# Patient Record
Sex: Male | Born: 1950 | Race: White | Hispanic: No | Marital: Married | Smoking: Former smoker
Health system: Southern US, Community
[De-identification: ages and names within clinical notes are randomized; demographics above are authoritative.]

## PROBLEM LIST (undated history)

## (undated) DIAGNOSIS — E119 Type 2 diabetes mellitus without complications: Secondary | ICD-10-CM

## (undated) DIAGNOSIS — L570 Actinic keratosis: Secondary | ICD-10-CM

## (undated) DIAGNOSIS — K08109 Complete loss of teeth, unspecified cause, unspecified class: Secondary | ICD-10-CM

## (undated) DIAGNOSIS — E039 Hypothyroidism, unspecified: Secondary | ICD-10-CM

## (undated) HISTORY — DX: Actinic keratosis: L57.0

---

## 1998-09-12 ENCOUNTER — Emergency Department (HOSPITAL_COMMUNITY): Admission: EM | Admit: 1998-09-12 | Discharge: 1998-09-12 | Payer: Self-pay | Admitting: Emergency Medicine

## 2008-08-25 ENCOUNTER — Ambulatory Visit: Payer: Self-pay | Admitting: Internal Medicine

## 2008-09-22 ENCOUNTER — Ambulatory Visit: Payer: Self-pay | Admitting: Internal Medicine

## 2008-11-21 ENCOUNTER — Ambulatory Visit: Payer: Self-pay | Admitting: General Surgery

## 2010-03-21 ENCOUNTER — Emergency Department: Payer: Self-pay | Admitting: Unknown Physician Specialty

## 2016-12-05 ENCOUNTER — Ambulatory Visit: Payer: Self-pay | Admitting: General Surgery

## 2016-12-27 ENCOUNTER — Telehealth: Payer: Self-pay | Admitting: *Deleted

## 2016-12-27 NOTE — Telephone Encounter (Signed)
Patient called and was scheduled to have a colonoscopy in 11/2016 and doesn't want to have that procedure done right now

## 2017-05-25 ENCOUNTER — Ambulatory Visit: Payer: Self-pay | Admitting: General Surgery

## 2017-09-21 ENCOUNTER — Ambulatory Visit: Payer: Self-pay | Admitting: General Surgery

## 2017-09-28 ENCOUNTER — Other Ambulatory Visit: Payer: Self-pay

## 2017-09-28 ENCOUNTER — Encounter: Payer: Self-pay | Admitting: Emergency Medicine

## 2017-09-28 ENCOUNTER — Emergency Department: Payer: Medicare Other

## 2017-09-28 ENCOUNTER — Emergency Department
Admission: EM | Admit: 2017-09-28 | Discharge: 2017-09-28 | Disposition: A | Discharge status: Home / Self Care | Payer: Medicare Other | Attending: Student in an Organized Health Care Education/Training Program | Admitting: Student in an Organized Health Care Education/Training Program

## 2017-09-28 DIAGNOSIS — Z87891 Personal history of nicotine dependence: Secondary | ICD-10-CM | POA: Diagnosis not present

## 2017-09-28 DIAGNOSIS — N201 Calculus of ureter: Secondary | ICD-10-CM | POA: Insufficient documentation

## 2017-09-28 DIAGNOSIS — R1032 Left lower quadrant pain: Secondary | ICD-10-CM | POA: Diagnosis present

## 2017-09-28 DIAGNOSIS — Z79899 Other long term (current) drug therapy: Secondary | ICD-10-CM | POA: Diagnosis not present

## 2017-09-28 DIAGNOSIS — E039 Hypothyroidism, unspecified: Secondary | ICD-10-CM | POA: Insufficient documentation

## 2017-09-28 HISTORY — DX: Hypothyroidism, unspecified: E03.9

## 2017-09-28 LAB — COMPREHENSIVE METABOLIC PANEL
ALBUMIN: 4.6 g/dL (ref 3.5–5.0)
ALK PHOS: 39 U/L (ref 38–126)
ALT: 29 U/L (ref 17–63)
ANION GAP: 11 (ref 5–15)
AST: 33 U/L (ref 15–41)
BILIRUBIN TOTAL: 0.6 mg/dL (ref 0.3–1.2)
BUN: 25 mg/dL — AB (ref 6–20)
CALCIUM: 9.3 mg/dL (ref 8.9–10.3)
CO2: 20 mmol/L — ABNORMAL LOW (ref 22–32)
Chloride: 110 mmol/L (ref 101–111)
Creatinine, Ser: 1.17 mg/dL (ref 0.61–1.24)
GFR calc Af Amer: >60 mL/min (ref >60)
GFR calc non Af Amer: >60 mL/min (ref >60)
GLUCOSE: 169 mg/dL — AB (ref 65–99)
Potassium: 3.6 mmol/L (ref 3.5–5.1)
Sodium: 141 mmol/L (ref 135–145)
TOTAL PROTEIN: 7.5 g/dL (ref 6.5–8.1)

## 2017-09-28 LAB — CBC
HCT: 45.1 % (ref 40.0–52.0)
Hemoglobin: 15.3 g/dL (ref 13.0–18.0)
MCH: 30.8 pg (ref 26.0–34.0)
MCHC: 34 g/dL (ref 32.0–36.0)
MCV: 90.6 fL (ref 80.0–100.0)
PLATELETS: 232 10*3/uL (ref 150–440)
RBC: 4.98 MIL/uL (ref 4.40–5.90)
RDW: 13.1 % (ref 11.5–14.5)
WBC: 9.4 10*3/uL (ref 3.8–10.6)

## 2017-09-28 LAB — URINALYSIS, COMPLETE (UACMP) WITH MICROSCOPIC
Bilirubin Urine: NEGATIVE
GLUCOSE, UA: NEGATIVE mg/dL
Ketones, ur: 5 mg/dL — AB
Leukocytes, UA: NEGATIVE
NITRITE: NEGATIVE
PH: 5 (ref 5.0–8.0)
Protein, ur: NEGATIVE mg/dL
Specific Gravity, Urine: 1.023 (ref 1.005–1.030)

## 2017-09-28 LAB — LIPASE, BLOOD: Lipase: 33 U/L (ref 11–51)

## 2017-09-28 MED ORDER — IOPAMIDOL (ISOVUE-300) INJECTION 61%
100.0000 mL | Freq: Once | INTRAVENOUS | Status: AC | PRN
  Administered 2017-09-28: 100 mL via INTRAVENOUS

## 2017-09-28 MED ORDER — MORPHINE SULFATE (PF) 4 MG/ML IV SOLN
4.0000 mg | INTRAVENOUS | Status: DC | PRN
  Administered 2017-09-28: 4 mg via INTRAVENOUS
  Filled 2017-09-28: qty 1

## 2017-09-28 MED ORDER — KETOROLAC TROMETHAMINE 30 MG/ML IJ SOLN
15.0000 mg | Freq: Once | INTRAMUSCULAR | Status: AC
  Administered 2017-09-28: 15 mg via INTRAVENOUS
  Filled 2017-09-28: qty 1

## 2017-09-28 MED ORDER — HYDROCODONE-ACETAMINOPHEN 5-325 MG PO TABS: 1.0000 | ORAL_TABLET | ORAL | 0 refills | Status: DC | PRN

## 2017-09-28 MED ORDER — PROMETHAZINE HCL 25 MG/ML IJ SOLN
12.5000 mg | Freq: Four times a day (QID) | INTRAMUSCULAR | Status: DC | PRN
  Administered 2017-09-28: 12.5 mg via INTRAVENOUS
  Filled 2017-09-28: qty 1

## 2017-09-28 MED ORDER — POLYETHYLENE GLYCOL 3350 17 G PO PACK: 17.0000 g | PACK | Freq: Every day | ORAL | 0 refills | Status: DC

## 2017-09-28 MED ORDER — TAMSULOSIN HCL 0.4 MG PO CAPS: 0.4000 mg | ORAL_CAPSULE | Freq: Every day | ORAL | 0 refills | Status: DC

## 2017-09-28 MED ORDER — FENTANYL CITRATE (PF) 100 MCG/2ML IJ SOLN
50.0000 ug | INTRAMUSCULAR | Status: DC | PRN
  Administered 2017-09-28: 50 ug via INTRAVENOUS
  Filled 2017-09-28: qty 2

## 2017-09-28 MED ORDER — ONDANSETRON 4 MG PO TBDP
4.0000 mg | ORAL_TABLET | Freq: Once | ORAL | Status: AC | PRN
  Administered 2017-09-28: 4 mg via ORAL
  Filled 2017-09-28: qty 1

## 2017-09-28 MED ORDER — SODIUM CHLORIDE 0.9 % IV BOLUS (SEPSIS)
500.0000 mL | Freq: Once | INTRAVENOUS | Status: AC
Start: 2017-09-28 — End: 2017-09-28
  Administered 2017-09-28: 500 mL via INTRAVENOUS

## 2017-09-28 NOTE — ED Notes (Signed)
Informed RN that patient has been roomed and is ready for evaluation.  Patient in NAD at this time and call bell placed within reach.   

## 2017-09-28 NOTE — ED Provider Notes (Signed)
[facility] Emergency Department Provider Note    None    (approximate)  I have reviewed the triage vital signs and the nursing notes.   HISTORY  Chief Complaint Abdominal Pain    HPI Paul [name] is a 67 y.o. male with a history of hypothyroidism taking levothyroxine no recent admissions to hospital presents with chief complaint of sudden onset left lower quadrant abdominal pain that he rates as severe and nonradiating.  States he does have some pain that will radiate to the groin and left testicle but is primarily located in the left groin.  None radiating through to his back.  States he did have some episodes of loose bowels.  States that yesterday he was feeling nauseated has never had pain anything like this before.  Denies any blood in his stool.  No noted dysuria or blood in his urine.  No chest pain or shortness of breath.  Past Medical History:  Diagnosis Date  . Hypothyroidism    History reviewed. No pertinent family history. History reviewed. No pertinent surgical history. There are no active problems to display for this patient.     Prior to Admission medications   Medication Sig Start Date End Date Taking? Authorizing Provider  LEVOXYL 25 MCG tablet Take 1 tablet by mouth daily. 06/24/17  Yes [provider]  HYDROcodone-acetaminophen (NORCO) 5-325 MG tablet Take 1 tablet by mouth every 4 (four) hours as needed for moderate pain. 09/28/17   Merlyn Lot, MD  polyethylene glycol [facility] / Floria Raveling) packet Take 17 g by mouth daily. Mix one tablespoon with 8oz of your favorite juice or water every day until you are having soft formed stools. Then start taking once daily if you didn't have a stool the day before. 09/28/17   Merlyn Lot, MD  tamsulosin (FLOMAX) 0.4 MG CAPS capsule Take 1 capsule (0.4 mg total) by mouth daily after supper. 09/28/17   Merlyn Lot, MD    Allergies Patient has no known  allergies.    Social History Social History   Tobacco Use  . Smoking status: Former Smoker    Last attempt to quit: 1999    Years since quitting: 20.2  . Smokeless tobacco: Never Used  Substance Use Topics  . Alcohol use: Yes    Comment: rarely  . Drug use: No    Review of Systems Patient denies headaches, rhinorrhea, blurry vision, numbness, shortness of breath, chest pain, edema, cough, abdominal pain, nausea, vomiting, diarrhea, dysuria, fevers, rashes or hallucinations unless otherwise stated above in HPI. ____________________________________________   PHYSICAL EXAM:  VITAL SIGNS: Vitals:   09/28/17 1900 09/28/17 1930  BP: 120/61 134/80  Pulse: 75 74  Resp:    Temp:    SpO2: 97% 95%    Constitutional: Alert and oriented. in no acute distress. Eyes: Conjunctivae are normal.  Head: Atraumatic. Nose: No congestion/rhinnorhea. Mouth/Throat: Mucous membranes are moist.   Neck: No stridor. Painless ROM.  Cardiovascular: Normal rate, regular rhythm. Grossly normal heart sounds.  Good peripheral circulation. Respiratory: Normal respiratory effort.  No retractions. Lungs CTAB. Gastrointestinal: Soft and nontender. No distention. No abdominal bruits. No CVA tenderness. Genitourinary:  Musculoskeletal: No lower extremity tenderness nor edema.  No joint effusions. Neurologic:  Normal speech and language. No gross focal neurologic deficits are appreciated. No facial droop Skin:  Skin is warm, dry and intact. No rash noted. Psychiatric: Mood and affect are normal. Speech and behavior are normal.  ____________________________________________   LABS (all labs ordered  are listed, but only abnormal results are displayed)  Results for orders placed or performed during the hospital encounter of 09/28/17 (from the past 24 hour(s))  Lipase, blood     Status: None   Collection Time: 09/28/17  3:21 PM  Result Value Ref Range   Lipase 33 11 - 51 U/L  Comprehensive metabolic  panel     Status: Abnormal   Collection Time: 09/28/17  3:21 PM  Result Value Ref Range   Sodium 141 135 - 145 mmol/L   Potassium 3.6 3.5 - 5.1 mmol/L   Chloride 110 101 - 111 mmol/L   CO2 20 (L) 22 - 32 mmol/L   Glucose, Bld 169 (H) 65 - 99 mg/dL   BUN 25 (H) 6 - 20 mg/dL   Creatinine, Ser 1.17 0.61 - 1.24 mg/dL   Calcium 9.3 8.9 - 10.3 mg/dL   Total Protein 7.5 6.5 - 8.1 g/dL   Albumin 4.6 3.5 - 5.0 g/dL   AST 33 15 - 41 U/L   ALT 29 17 - 63 U/L   Alkaline Phosphatase 39 38 - 126 U/L   Total Bilirubin 0.6 0.3 - 1.2 mg/dL   GFR calc non Af Amer >60 >60 mL/min   GFR calc Af Amer >60 >60 mL/min   Anion gap 11 5 - 15  CBC     Status: None   Collection Time: 09/28/17  3:21 PM  Result Value Ref Range   WBC 9.4 3.8 - 10.6 K/uL   RBC 4.98 4.40 - 5.90 MIL/uL   Hemoglobin 15.3 13.0 - 18.0 g/dL   HCT 45.1 40.0 - 52.0 %   MCV 90.6 80.0 - 100.0 fL   MCH 30.8 26.0 - 34.0 pg   MCHC 34.0 32.0 - 36.0 g/dL   RDW 13.1 11.5 - 14.5 %   Platelets 232 150 - 440 K/uL  Urinalysis, Complete w Microscopic     Status: Abnormal   Collection Time: 09/28/17  3:21 PM  Result Value Ref Range   Color, Urine YELLOW (A) YELLOW   APPearance HAZY (A) CLEAR   Specific Gravity, Urine 1.023 1.005 - 1.030   pH 5.0 5.0 - 8.0   Glucose, UA NEGATIVE NEGATIVE mg/dL   Hgb urine dipstick LARGE (A) NEGATIVE   Bilirubin Urine NEGATIVE NEGATIVE   Ketones, ur 5 (A) NEGATIVE mg/dL   Protein, ur NEGATIVE NEGATIVE mg/dL   Nitrite NEGATIVE NEGATIVE   Leukocytes, UA NEGATIVE NEGATIVE   RBC / HPF TOO NUMEROUS TO COUNT 0 - 5 RBC/hpf   WBC, UA 0-5 0 - 5 WBC/hpf   Bacteria, UA RARE (A) NONE SEEN   Squamous Epithelial / LPF 0-5 (A) NONE SEEN   Mucus PRESENT    Budding Yeast PRESENT    ____________________________________________  ____________________________________________  RADIOLOGY  I personally reviewed all radiographic images ordered to evaluate for the above acute complaints and reviewed radiology reports and  findings.  These findings were personally discussed with the patient.  Please see medical record for radiology report.  ____________________________________________   PROCEDURES  Procedure(s) performed:  Procedures    Critical Care performed: no ____________________________________________   INITIAL IMPRESSION / ASSESSMENT AND PLAN / ED COURSE  Pertinent labs & imaging results that were available during my care of the patient were reviewed by me and considered in my medical decision making (see chart for details).  DDX: Stone, Pilo, diverticulitis, perforation, colitis, UTI  TAHA DIMOND is a 67 y.o. who presents to the ED with symptoms as  described above.  Sudden onset left lower quadrant pain concerning for the above differential.  Blood work sent for the above differential shows no leukocytosis-or significant acidosis.  Urinalysis does show hematuria without pyuria or bacteria.  CT imaging ordered to further evaluate for the patient's pain shows evidence of distal left UPJ stone with associated hydronephrosis and perinephric edema.  No evidence of sepsis or infectious process.  Patient given IV fluids and IV pain medication.  Will reassess for pain control.  Clinical Course as of Sep 28 1952  Thu Sep 28, 2017  1949 Patient reassessed and feels pain is resolved.  He is tolerating oral hydration.  Able to ambulate with steady gait.  This point do believe patient stable and appropriate for outpatient referral to urology.  [PR]    Clinical Course User Index [PR] Merlyn Lot, MD     ____________________________________________   FINAL CLINICAL IMPRESSION(S) / ED DIAGNOSES  Final diagnoses:  Left lower quadrant pain  Ureterolithiasis      NEW MEDICATIONS STARTED DURING THIS VISIT:  New Prescriptions   HYDROCODONE-ACETAMINOPHEN (NORCO) 5-325 MG TABLET    Take 1 tablet by mouth every 4 (four) hours as needed for moderate pain.   POLYETHYLENE GLYCOL (MIRALAX /  GLYCOLAX) PACKET    Take 17 g by mouth daily. Mix one tablespoon with 8oz of your favorite juice or water every day until you are having soft formed stools. Then start taking once daily if you didn't have a stool the day before.   TAMSULOSIN (FLOMAX) 0.4 MG CAPS CAPSULE    Take 1 capsule (0.4 mg total) by mouth daily after supper.     Note:  This document was prepared using Dragon voice recognition software and may include unintentional dictation errors.    Merlyn Lot, MD 09/28/17 [phone]

## 2017-09-28 NOTE — ED Triage Notes (Addendum)
Pt presents to ED c/o nausea x1 week, diarrhea today, and LLQ pain. Family report pt has a sister seen at [facility] who was recently dx with C-Diff and that pt took 1 dose of bactrim because "we thought he might have some inflammation."

## 2017-09-28 NOTE — ED Notes (Signed)
Patient informed this RN that he needed to have a bowel movement and that he wanted to provide a sample since he has been having diarrhea.  Patient given hat for sample.  Sample was a well formed stool at this time and not sent down to lab due to not meeting criteria for c-diff protocol.

## 2017-09-28 NOTE — ED Notes (Signed)
Patient transported to CT 

## 2017-10-09 DIAGNOSIS — C4491 Basal cell carcinoma of skin, unspecified: Secondary | ICD-10-CM

## 2017-10-09 HISTORY — DX: Basal cell carcinoma of skin, unspecified: C44.91

## 2017-10-12 ENCOUNTER — Encounter: Payer: Self-pay | Admitting: Urology

## 2017-10-12 ENCOUNTER — Ambulatory Visit (INDEPENDENT_AMBULATORY_CARE_PROVIDER_SITE_OTHER): Payer: Medicare Other | Admitting: Urology

## 2017-10-12 VITALS — BP 125/81 | HR 87 | Ht 71.0 in | Wt 220.0 lb

## 2017-10-12 DIAGNOSIS — N200 Calculus of kidney: Secondary | ICD-10-CM | POA: Diagnosis not present

## 2017-10-12 LAB — URINALYSIS, COMPLETE
Bilirubin, UA: NEGATIVE
Glucose, UA: NEGATIVE
KETONES UA: NEGATIVE
NITRITE UA: NEGATIVE
Protein, UA: NEGATIVE
RBC, UA: NEGATIVE
Specific Gravity, UA: 1.02 (ref 1.005–1.030)
UUROB: 0.2 mg/dL (ref 0.2–1.0)
pH, UA: 5.5 (ref 5.0–7.5)

## 2017-10-12 LAB — MICROSCOPIC EXAMINATION

## 2017-10-12 MED ORDER — TAMSULOSIN HCL 0.4 MG PO CAPS: 0.4000 mg | ORAL_CAPSULE | Freq: Every day | ORAL | 0 refills | Status: DC

## 2017-10-12 NOTE — Progress Notes (Signed)
10/12/2017 3:24 PM   Paul [name] Dec 26, 1950 [id number]  Referring provider: Adin Hector, MD [facility] [place], [place] [address]  Chief Complaint  Patient presents with  . Nephrolithiasis    HPI: The patient is a 67 year old gentleman who presents for ER follow-up of a 4 x 3 mm left UPJ calculus diagnosed 2 weeks ago.  The patient originally presented with nausea and left-sided flank pain.  This was the only stone seen in the CT scan.  Since his ER visit, his pain is been much more controlled.  However it still persistent and now is lower in his flank and left lower quadrant.  He does not think he has passed the stone.  He is strained with a homemade strainer.  He is interested in continue medical expulsive therapy.  This is his first stone episode.   PMH: Past Medical History:  Diagnosis Date  . Hypothyroidism     Surgical History: History reviewed. No pertinent surgical history.  Home Medications:  Allergies as of 10/12/2017   No Known Allergies     Medication List        Accurate as of 10/12/17  3:24 PM. Always use your most recent med list.          acyclovir 400 MG tablet Commonly known as:  ZOVIRAX Take by mouth.   HYDROcodone-acetaminophen 5-325 MG tablet Commonly known as:  NORCO Take 1 tablet by mouth every 4 (four) hours as needed for moderate pain.   LEVOXYL 25 MCG tablet Generic drug:  levothyroxine Take 1 tablet by mouth daily.   polyethylene glycol packet Commonly known as:  MIRALAX / GLYCOLAX Take 17 g by mouth daily. Mix one tablespoon with 8oz of your favorite juice or water every day until you are having soft formed stools. Then start taking once daily if you didn't have a stool the day before.   tamsulosin 0.4 MG Caps capsule Commonly known as:  FLOMAX Take 1 capsule (0.4 mg total) by mouth daily after supper.       Allergies: No Known Allergies  Family History: Family History  Problem  Relation Age of Onset  . Prostate cancer Neg Hx   . Bladder Cancer Neg Hx   . Kidney cancer Neg Hx     Social History:  reports that he quit smoking about 20 years ago. He has never used smokeless tobacco. He reports that he drinks alcohol. He reports that he does not use drugs.  ROS: UROLOGY Frequent Urination?: No Hard to postpone urination?: No Burning/pain with urination?: No Get up at night to urinate?: Yes Leakage of urine?: No Urine stream starts and stops?: No Trouble starting stream?: No Do you have to strain to urinate?: No Blood in urine?: Yes Urinary tract infection?: No Sexually transmitted disease?: No Injury to kidneys or bladder?: No Painful intercourse?: No Weak stream?: No Erection problems?: No Penile pain?: No  Gastrointestinal Nausea?: Yes Vomiting?: Yes Indigestion/heartburn?: No Diarrhea?: Yes Constipation?: No  Constitutional Fever: No Night sweats?: No Weight loss?: Yes Fatigue?: Yes  Skin Skin rash/lesions?: No Itching?: No  Eyes Blurred vision?: No Double vision?: No  Ears/Nose/Throat Sore throat?: No Sinus problems?: No  Hematologic/Lymphatic Swollen glands?: No Easy bruising?: No  Cardiovascular Leg swelling?: No Chest pain?: No  Respiratory Cough?: Yes Shortness of breath?: No  Endocrine Excessive thirst?: No  Musculoskeletal Back pain?: Yes Joint pain?: Yes  Neurological Headaches?: Yes Dizziness?: No  Psychologic Depression?: No Anxiety?: No  Physical Exam: BP 125/81   Pulse 87   Ht 5\' 11"  (1.803 m)   Wt 220 lb (99.8 kg)   BMI 30.68 kg/m   Constitutional:  Alert and oriented, No acute distress. HEENT: [place] AT, moist mucus membranes.  Trachea midline, no masses. Cardiovascular: No clubbing, cyanosis, or edema. Respiratory: Normal respiratory effort, no increased work of breathing. GI: Abdomen is soft, nontender, nondistended, no abdominal masses GU: No CVA tenderness.  Skin: No rashes, bruises or  suspicious lesions. Lymph: No cervical or inguinal adenopathy. Neurologic: Grossly intact, no focal deficits, moving all 4 extremities. Psychiatric: Normal mood and affect.  Laboratory Data: Lab Results  Component Value Date   WBC 9.4 09/28/2017   HGB 15.3 09/28/2017   HCT 45.1 09/28/2017   MCV 90.6 09/28/2017   PLT 232 09/28/2017    Lab Results  Component Value Date   CREATININE 1.17 09/28/2017    No results found for: PSA  No results found for: TESTOSTERONE  No results found for: HGBA1C  Urinalysis    Component Value Date/Time   COLORURINE YELLOW (A) 09/28/2017 1521   APPEARANCEUR HAZY (A) 09/28/2017 1521   LABSPEC 1.023 09/28/2017 1521   PHURINE 5.0 09/28/2017 1521   GLUCOSEU NEGATIVE 09/28/2017 1521   HGBUR LARGE (A) 09/28/2017 1521   BILIRUBINUR NEGATIVE 09/28/2017 1521   KETONESUR 5 (A) 09/28/2017 1521   PROTEINUR NEGATIVE 09/28/2017 1521   NITRITE NEGATIVE 09/28/2017 1521   LEUKOCYTESUR NEGATIVE 09/28/2017 1521    Pertinent Imaging: CT reviewed as above  Assessment & Plan:    1.  Left ureteral stone The patient still has symptoms of the presence of a stone.  He would like to continue medical expulsive therapy at this time.  We will have him follow-up in 2 weeks with a KUB prior.  In the interim, he will strain his urine.  He will also continue Flomax.   Return in about 2 weeks (around 10/26/2017) for KUB prior.  Nickie Retort, MD  [facility] [address], [place], [place] [address] [phone]

## 2017-10-20 NOTE — Progress Notes (Addendum)
10/23/2017 9:17 AM   Paul [name] 07-29-1950 [id number]  Referring provider: Adin Hector, MD [facility] [place] [address], [place] [address]  No chief complaint on file.   HPI: Patient is a 67 year old Caucasian male on MET for a 4 x 3 mm left UPJ stone with his wife, Paul [name].    10/12/2017 visit The patient is a 67 year old gentleman who presents for ER follow-up of a 4 x 3 mm left UPJ calculus diagnosed 2 weeks ago.  The patient originally presented with nausea and left-sided flank pain.  This was the only stone seen in the CT scan.  Since his ER visit, his pain is been much more controlled.  However it still persistent and now is lower in his flank and left lower quadrant.  He does not think he has passed the stone.  He is strained with a homemade strainer.  He is interested in continue medical expulsive therapy.  This is his first stone episode.  Today, he brings in a stone for analysis.  He passed it on 10/14/2017.  Patient denies any gross hematuria, dysuria or suprapubic/flank pain.  Patient denies any fevers, chills, nausea or vomiting.   His KUB on 10/23/2017 was negative for stones.    PMH: Past Medical History:  Diagnosis Date  . Hypothyroidism     Surgical History: History reviewed. No pertinent surgical history.  Home Medications:  Allergies as of 10/23/2017   No Known Allergies     Medication List        Accurate as of 10/23/17  9:17 AM. Always use your most recent med list.          acyclovir 400 MG tablet Commonly known as:  ZOVIRAX Take by mouth.   HYDROcodone-acetaminophen 5-325 MG tablet Commonly known as:  NORCO Take 1 tablet by mouth every 4 (four) hours as needed for moderate pain.   LEVOXYL 25 MCG tablet Generic drug:  levothyroxine Take 1 tablet by mouth daily.   polyethylene glycol packet Commonly known as:  MIRALAX / GLYCOLAX Take 17 g by mouth daily. Mix one tablespoon with 8oz of your favorite juice or water  every day until you are having soft formed stools. Then start taking once daily if you didn't have a stool the day before.   tamsulosin 0.4 MG Caps capsule Commonly known as:  FLOMAX Take 1 capsule (0.4 mg total) by mouth daily after supper.   tamsulosin 0.4 MG Caps capsule Commonly known as:  FLOMAX Take 1 capsule (0.4 mg total) by mouth daily.       Allergies: No Known Allergies  Family History: Family History  Problem Relation Age of Onset  . Prostate cancer Neg Hx   . Bladder Cancer Neg Hx   . Kidney cancer Neg Hx     Social History:  reports that he quit smoking about 20 years ago. He has never used smokeless tobacco. He reports that he drinks alcohol. He reports that he does not use drugs.  ROS: UROLOGY Frequent Urination?: No Hard to postpone urination?: No Burning/pain with urination?: No Get up at night to urinate?: No Leakage of urine?: No Urine stream starts and stops?: No Trouble starting stream?: No Do you have to strain to urinate?: No Blood in urine?: No Urinary tract infection?: No Sexually transmitted disease?: No Injury to kidneys or bladder?: No Painful intercourse?: No Weak stream?: No Erection problems?: No Penile pain?: No  Gastrointestinal Nausea?: No Vomiting?: No Indigestion/heartburn?:  Yes Diarrhea?: No Constipation?: No  Constitutional Fever: No Night sweats?: No Weight loss?: No Fatigue?: No  Skin Skin rash/lesions?: No Itching?: No  Eyes Blurred vision?: No Double vision?: No  Ears/Nose/Throat Sore throat?: No Sinus problems?: No  Hematologic/Lymphatic Swollen glands?: No Easy bruising?: No  Cardiovascular Leg swelling?: No Chest pain?: No  Respiratory Cough?: No Shortness of breath?: No  Endocrine Excessive thirst?: No  Musculoskeletal Back pain?: No Joint pain?: No  Neurological Headaches?: No Dizziness?: No  Psychologic Depression?: No Anxiety?: No  Physical Exam: BP 118/79   Pulse (!) 59    Resp 16   Ht '5\' 11"'$  (1.803 m)   Wt 225 lb 9.6 oz (102.3 kg)   SpO2 98%   BMI 31.46 kg/m   .Constitutional: Well nourished. Alert and oriented, No acute distress. HEENT: [place] AT, moist mucus membranes. Trachea midline, no masses. Cardiovascular: No clubbing, cyanosis, or edema. Respiratory: Normal respiratory effort, no increased work of breathing. GI: Abdomen is soft, non tender, non distended, no abdominal masses. Liver and spleen not palpable.  No hernias appreciated.  Stool sample for occult testing is not indicated.   GU: No CVA tenderness.  No bladder fullness or masses.   Skin: No rashes, bruises or suspicious lesions. Lymph: No cervical or inguinal adenopathy. Neurologic: Grossly intact, no focal deficits, moving all 4 extremities. Psychiatric: Normal mood and affect.  Laboratory Data: Lab Results  Component Value Date   WBC 9.4 09/28/2017   HGB 15.3 09/28/2017   HCT 45.1 09/28/2017   MCV 90.6 09/28/2017   PLT 232 09/28/2017    Lab Results  Component Value Date   CREATININE 1.17 09/28/2017    No results found for: PSA  No results found for: TESTOSTERONE  No results found for: HGBA1C  Urinalysis    Component Value Date/Time   COLORURINE YELLOW (A) 09/28/2017 1521   APPEARANCEUR Clear 10/12/2017 1456   LABSPEC 1.023 09/28/2017 1521   PHURINE 5.0 09/28/2017 1521   GLUCOSEU Negative 10/12/2017 1456   HGBUR LARGE (A) 09/28/2017 1521   BILIRUBINUR Negative 10/12/2017 1456   KETONESUR 5 (A) 09/28/2017 1521   PROTEINUR Negative 10/12/2017 1456   PROTEINUR NEGATIVE 09/28/2017 1521   NITRITE Negative 10/12/2017 1456   NITRITE NEGATIVE 09/28/2017 1521   LEUKOCYTESUR 1+ (A) 10/12/2017 1456    Pertinent Imaging: CLINICAL DATA:  Left ureteral calculus  EXAM: ABDOMEN - 1 VIEW  COMPARISON:  CT abdomen and pelvis September 28, 2017  FINDINGS: Supine abdomen obtained. No abnormal calcifications are evident. There is moderate stool in the colon. There is no  bowel dilatation or air-fluid level to suggest bowel obstruction. No free air. There is lumbar dextroscoliosis.  IMPRESSION: No abnormal calcifications are evident by radiography. No bowel obstruction or free air.   Electronically Signed   By: Lowella Grip III M.D.   On: 10/23/2017 08:08  I have independently reviewed the films  Assessment & Plan:    1.  Left ureteral stone Joaquim Lai has been passed will send for analysis  2. Left Hydronephrosis Due to obstructing stone Will obtain a RUS once stone is passage to ensure hydronephrosis has resolved  Return for I will call patient with results.  Zara Council, [facility] [address] [place],  [address] [phone]

## 2017-10-23 ENCOUNTER — Encounter: Payer: Self-pay | Admitting: Urology

## 2017-10-23 ENCOUNTER — Ambulatory Visit (INDEPENDENT_AMBULATORY_CARE_PROVIDER_SITE_OTHER): Payer: Medicare Other | Admitting: Urology

## 2017-10-23 ENCOUNTER — Ambulatory Visit
Admission: RE | Admit: 2017-10-23 | Discharge: 2017-10-23 | Disposition: A | Discharge status: Home / Self Care | Payer: Medicare Other | Source: Ambulatory Visit | Attending: Urology | Admitting: Urology

## 2017-10-23 VITALS — BP 118/79 | HR 59 | Resp 16 | Ht 71.0 in | Wt 225.6 lb

## 2017-10-23 DIAGNOSIS — Z87442 Personal history of urinary calculi: Secondary | ICD-10-CM | POA: Insufficient documentation

## 2017-10-23 DIAGNOSIS — N132 Hydronephrosis with renal and ureteral calculous obstruction: Secondary | ICD-10-CM | POA: Diagnosis not present

## 2017-10-23 DIAGNOSIS — N200 Calculus of kidney: Secondary | ICD-10-CM

## 2017-10-23 DIAGNOSIS — N201 Calculus of ureter: Secondary | ICD-10-CM | POA: Diagnosis not present

## 2017-11-06 ENCOUNTER — Ambulatory Visit: Payer: Medicare Other

## 2017-11-08 ENCOUNTER — Other Ambulatory Visit: Payer: Self-pay | Admitting: Urology

## 2017-11-20 ENCOUNTER — Ambulatory Visit: Payer: Medicare Other

## 2017-12-13 ENCOUNTER — Ambulatory Visit
Admission: RE | Admit: 2017-12-13 | Discharge: 2017-12-13 | Disposition: A | Discharge status: Home / Self Care | Payer: Medicare Other | Source: Ambulatory Visit | Attending: Urology | Admitting: Urology

## 2017-12-13 DIAGNOSIS — N201 Calculus of ureter: Secondary | ICD-10-CM | POA: Diagnosis present

## 2017-12-13 DIAGNOSIS — K760 Fatty (change of) liver, not elsewhere classified: Secondary | ICD-10-CM | POA: Insufficient documentation

## 2017-12-13 DIAGNOSIS — N132 Hydronephrosis with renal and ureteral calculous obstruction: Secondary | ICD-10-CM

## 2017-12-14 ENCOUNTER — Telehealth: Payer: Self-pay

## 2017-12-14 NOTE — Telephone Encounter (Signed)
Called pt informed him of the information below. Pt gave verbal understanding.  

## 2017-12-14 NOTE — Telephone Encounter (Signed)
-----   Message from Nori Riis, PA-C sent at 12/13/2017  5:00 PM EDT ----- Please let Mr. Legrand know that his kidney is no longer swollen on the RUS.

## 2018-01-28 NOTE — Progress Notes (Signed)
I do not see where we have contacted Mr. Tupy regarding his stone analysis.  It returned at 87% calcium oxalate which is the most common type of stone people form.  He best chance of preventing more stones is to increase water intake, reduce animal protein in his diet and salt and stay away from sodas.

## 2018-01-30 NOTE — Progress Notes (Signed)
My chart message sent to patient with information.

## 2018-03-07 ENCOUNTER — Telehealth: Payer: Self-pay | Admitting: *Deleted

## 2018-03-07 NOTE — Telephone Encounter (Signed)
Talk to patient today and he states he did not want to follow back for his colonoscopy or do a color guard.

## 2018-12-23 IMAGING — CT CT ABD-PELV W/ CM
2 of 5 series · 15 of 46 positions shown, 17 images · IV contrast (APPLIED)
Comparison: None.

CLINICAL DATA: Nausea and left-sided abdominal pain

EXAM:
CT ABDOMEN AND PELVIS WITH CONTRAST
TECHNIQUE: Multidetector CT imaging of the abdomen and pelvis was performed
using the standard protocol following bolus administration of
intravenous contrast.
CONTRAST:  100mL 9NNGWA-HFF IOPAMIDOL (9NNGWA-HFF) INJECTION 61%

[Series 2: routine abd/pel with · axial · 0.83mm/px · z∈[-1014,-544]mm · 12 of 108 slices shown, 14 images]
[im 7/108  soft-tissue]
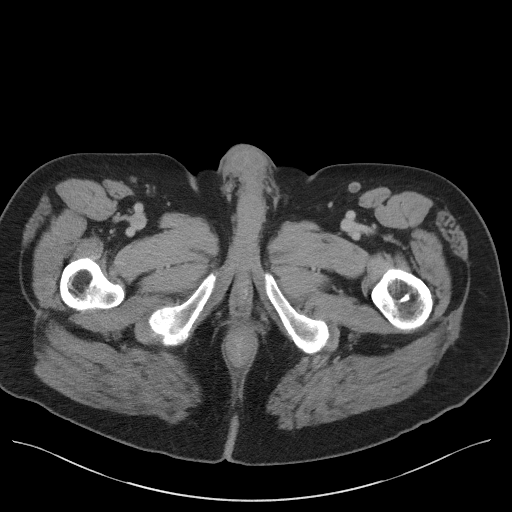
[im 7/108  bone]
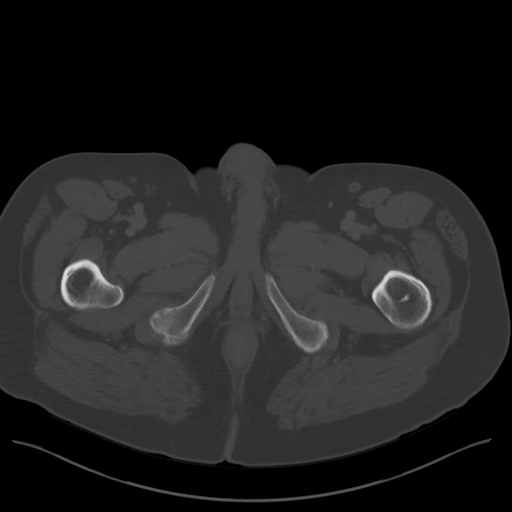
[im 19/108  soft-tissue]
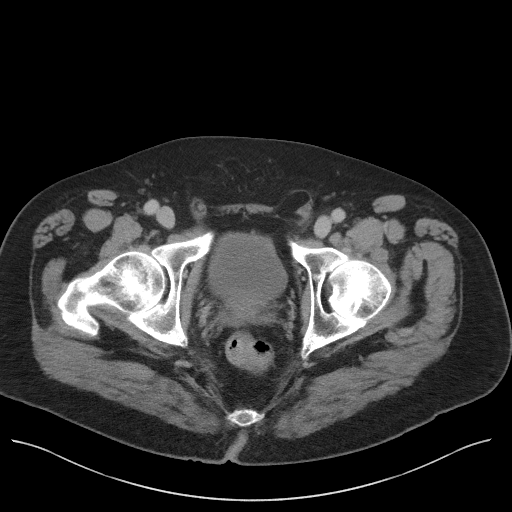
[im 26/108  soft-tissue]
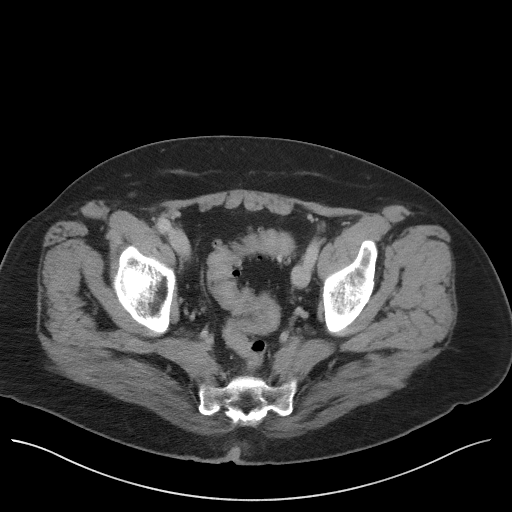
[im 32/108  soft-tissue]
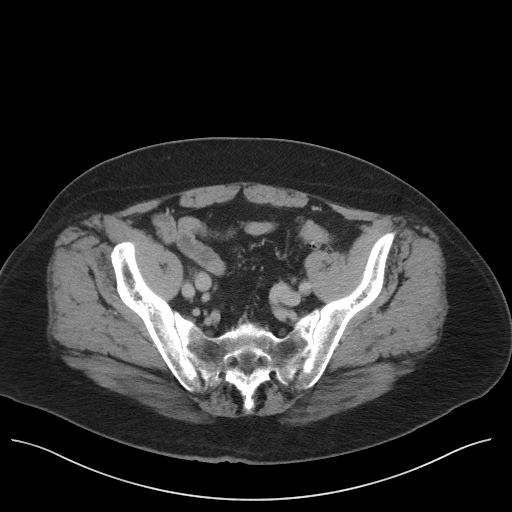
[im 45/108  soft-tissue]
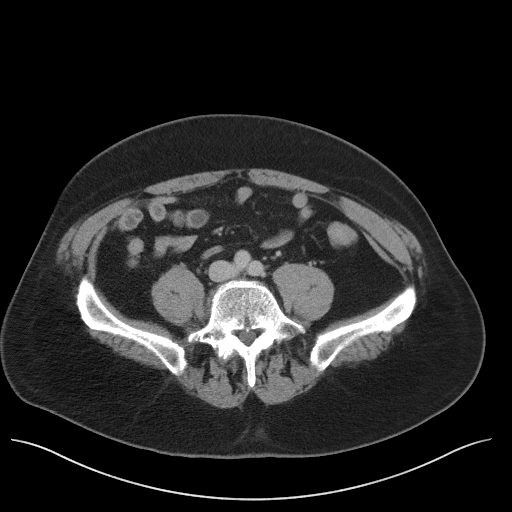
[im 51/108  soft-tissue]
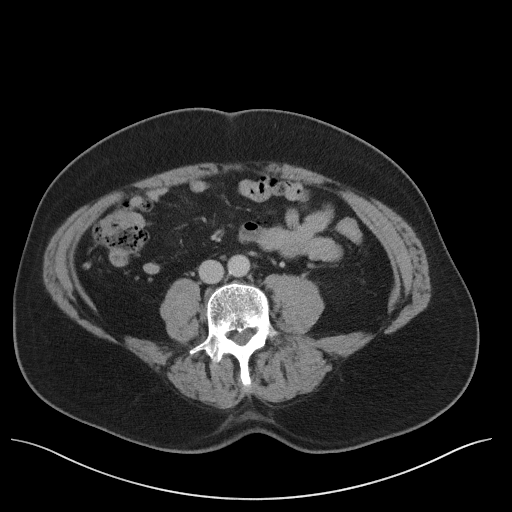
[im 57/108  soft-tissue]
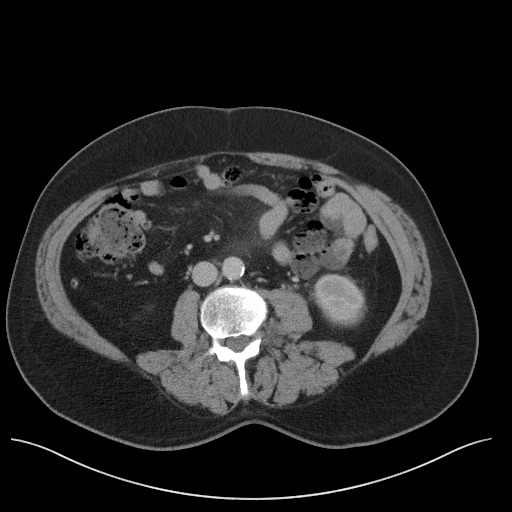
[im 70/108  soft-tissue]
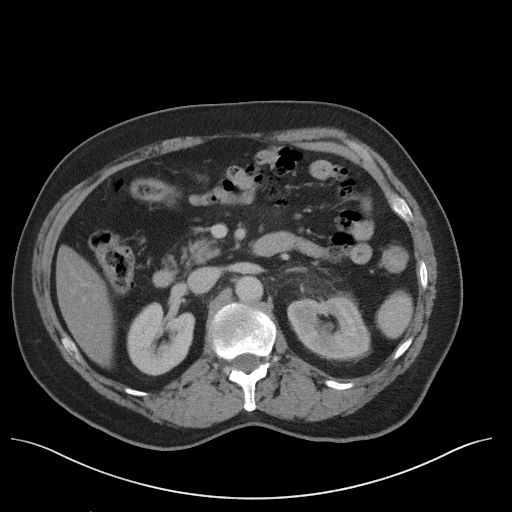
[im 76/108  soft-tissue]
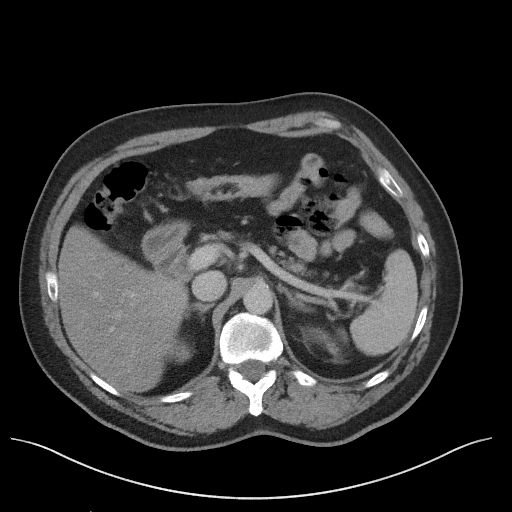
[im 76/108  bone]
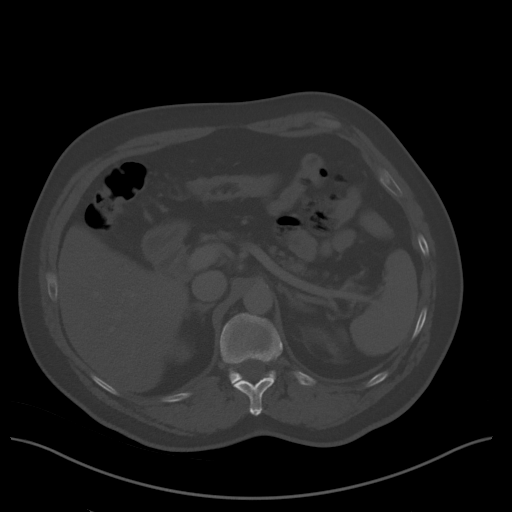
[im 82/108  soft-tissue]
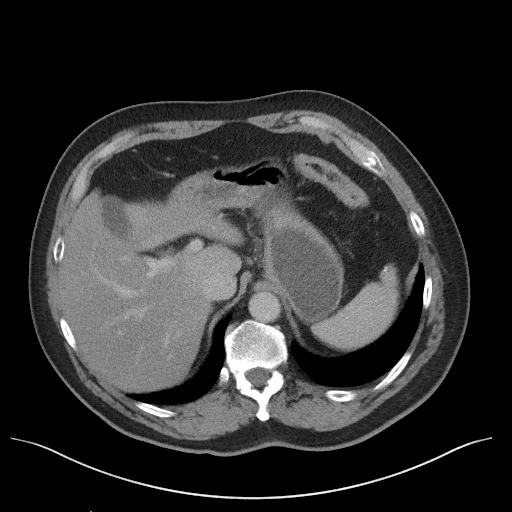
[im 95/108  soft-tissue]
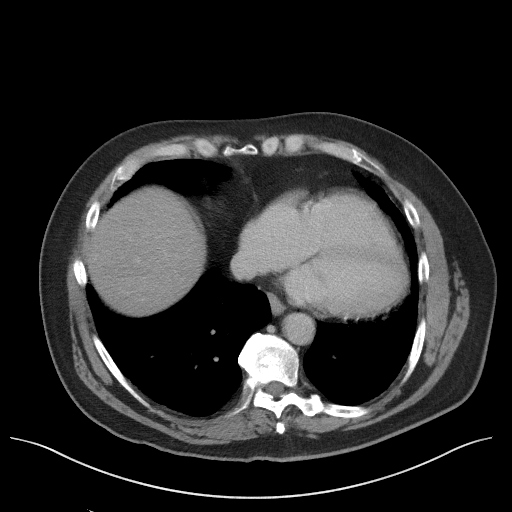
[im 101/108  soft-tissue]
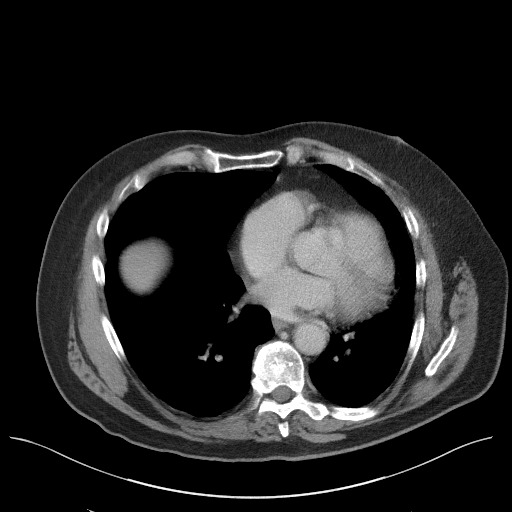

[Series 5: coronal st · coronal · 0.88mm/px · 3 of 96 slices shown]
[im 32/96  soft-tissue]
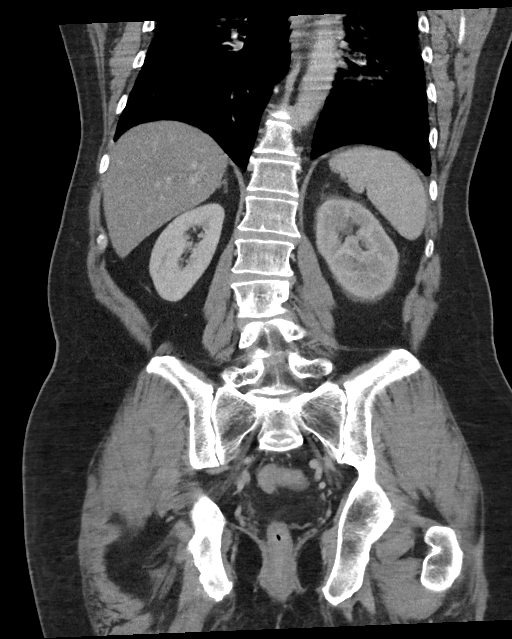
[im 43/96  soft-tissue]
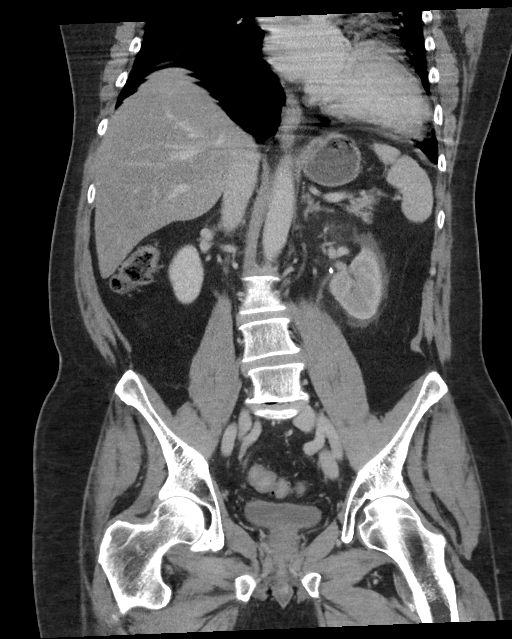
[im 53/96  soft-tissue]
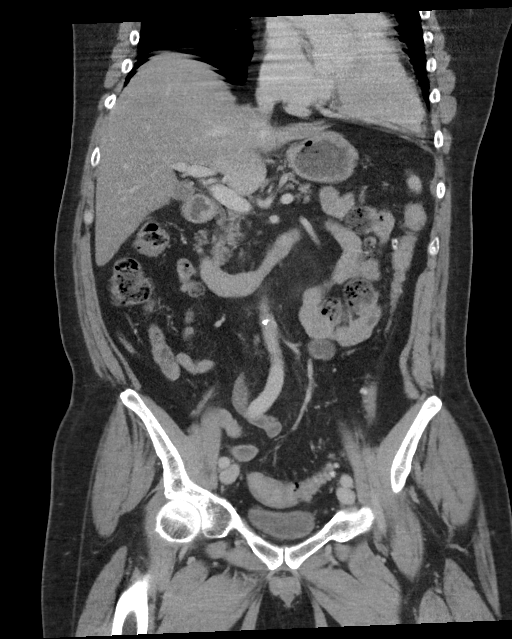

[15 of 46 positions shown; findings below may reference images not displayed]

FINDINGS: Lower chest: There is patchy lower lobe atelectatic change
bilaterally. No consolidation in the lung bases.

Hepatobiliary: There is hepatic steatosis. No focal liver lesions
are evident. Gallbladder wall does not appear appreciably thickened.
There is no biliary duct dilatation.

Pancreas: No pancreatic mass or inflammatory focus evident. There is
fatty infiltration in portions of the pancreas.

Spleen: No splenic lesions are evident.

Adrenals/Urinary Tract: Adrenals appear unremarkable bilaterally.
There is edema involving the left kidney with perinephric fluid on
the left. There is no renal mass on either side. There is mild
hydronephrosis on the left. There is no hydronephrosis on the right.
There is no intrarenal calculus on either side. There is a focal
calculus at the left ureteropelvic junction measuring 4 x 3 mm. No
other ureteral calculus evident on either side. Urinary bladder is
midline with wall thickness within normal limits.

Stomach/Bowel: There are sigmoid diverticula without diverticulitis.
There is no appreciable bowel wall or mesenteric thickening. No
evident bowel obstruction. No free air or portal venous air.

Vascular/Lymphatic: There are foci of atherosclerotic calcification
in the aorta and right common iliac artery. Major mesenteric vessels
appear patent. No aneurysm evident. There is no adenopathy in the
abdomen or pelvis.

Reproductive: Prostate and seminal vesicles are normal in size and
contour. No evident pelvic mass.

Other: Appendix appears normal. No abscess or ascites is appreciable
in the abdomen or pelvis.

Musculoskeletal: There is lumbar dextroscoliosis. There are foci of
degenerative change in the lumbar spine. There are no blastic or
lytic bone lesions. There is no intramuscular or abdominal wall
lesion.
IMPRESSION: 1. 4 x 3 mm calculus at the left ureteropelvic junction with mild
hydronephrosis and left renal edema/perinephric fluid. No other
ureteral calculi.

2. Sigmoid diverticula without diverticulitis. No bowel obstruction.
No abscess. Appendix appears normal.

3.  There is aortoiliac atherosclerosis.  No aneurysm.

4.  Hepatic steatosis.

## 2019-01-17 IMAGING — CR DG ABDOMEN 1V
2 series · 2 of 2 positions shown · non-contrast
Comparison: CT abdomen and pelvis September 28, 2017

CLINICAL DATA: Left ureteral calculus

EXAM:
ABDOMEN - 1 VIEW

[abdomen kub (1 of 2)]
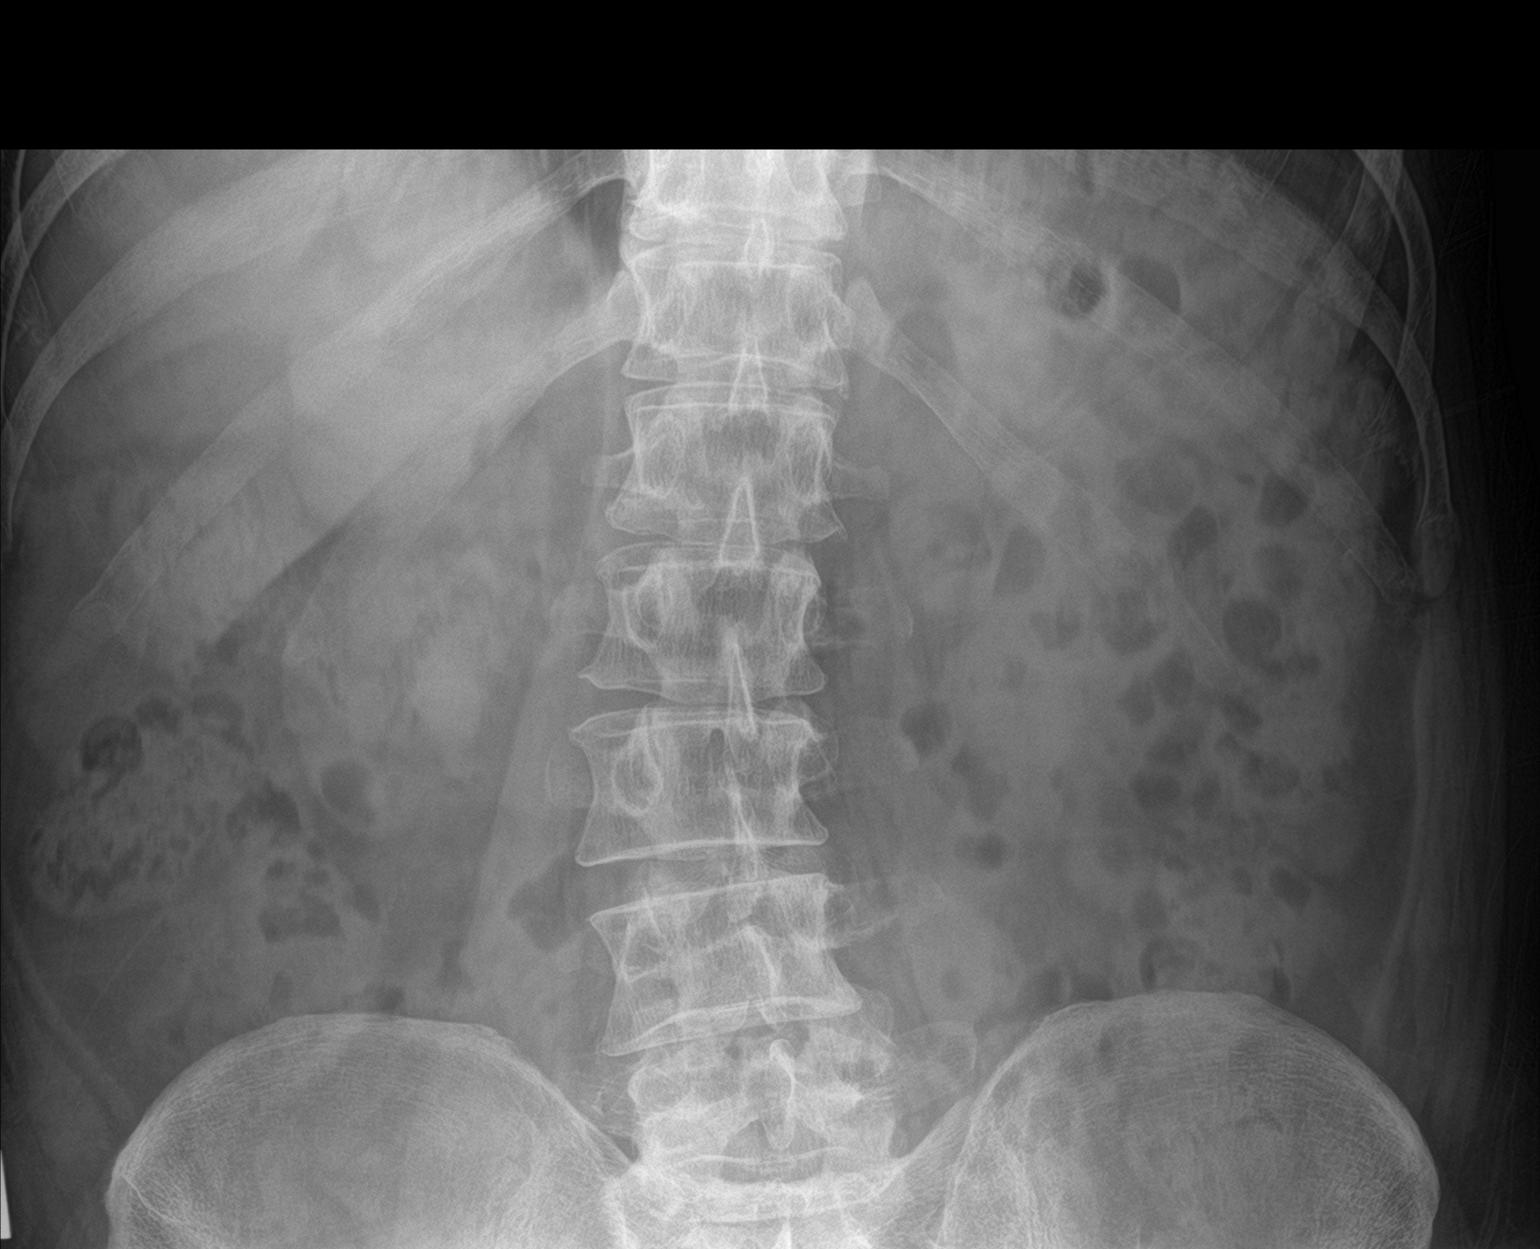

[abdomen kub (2 of 2)]
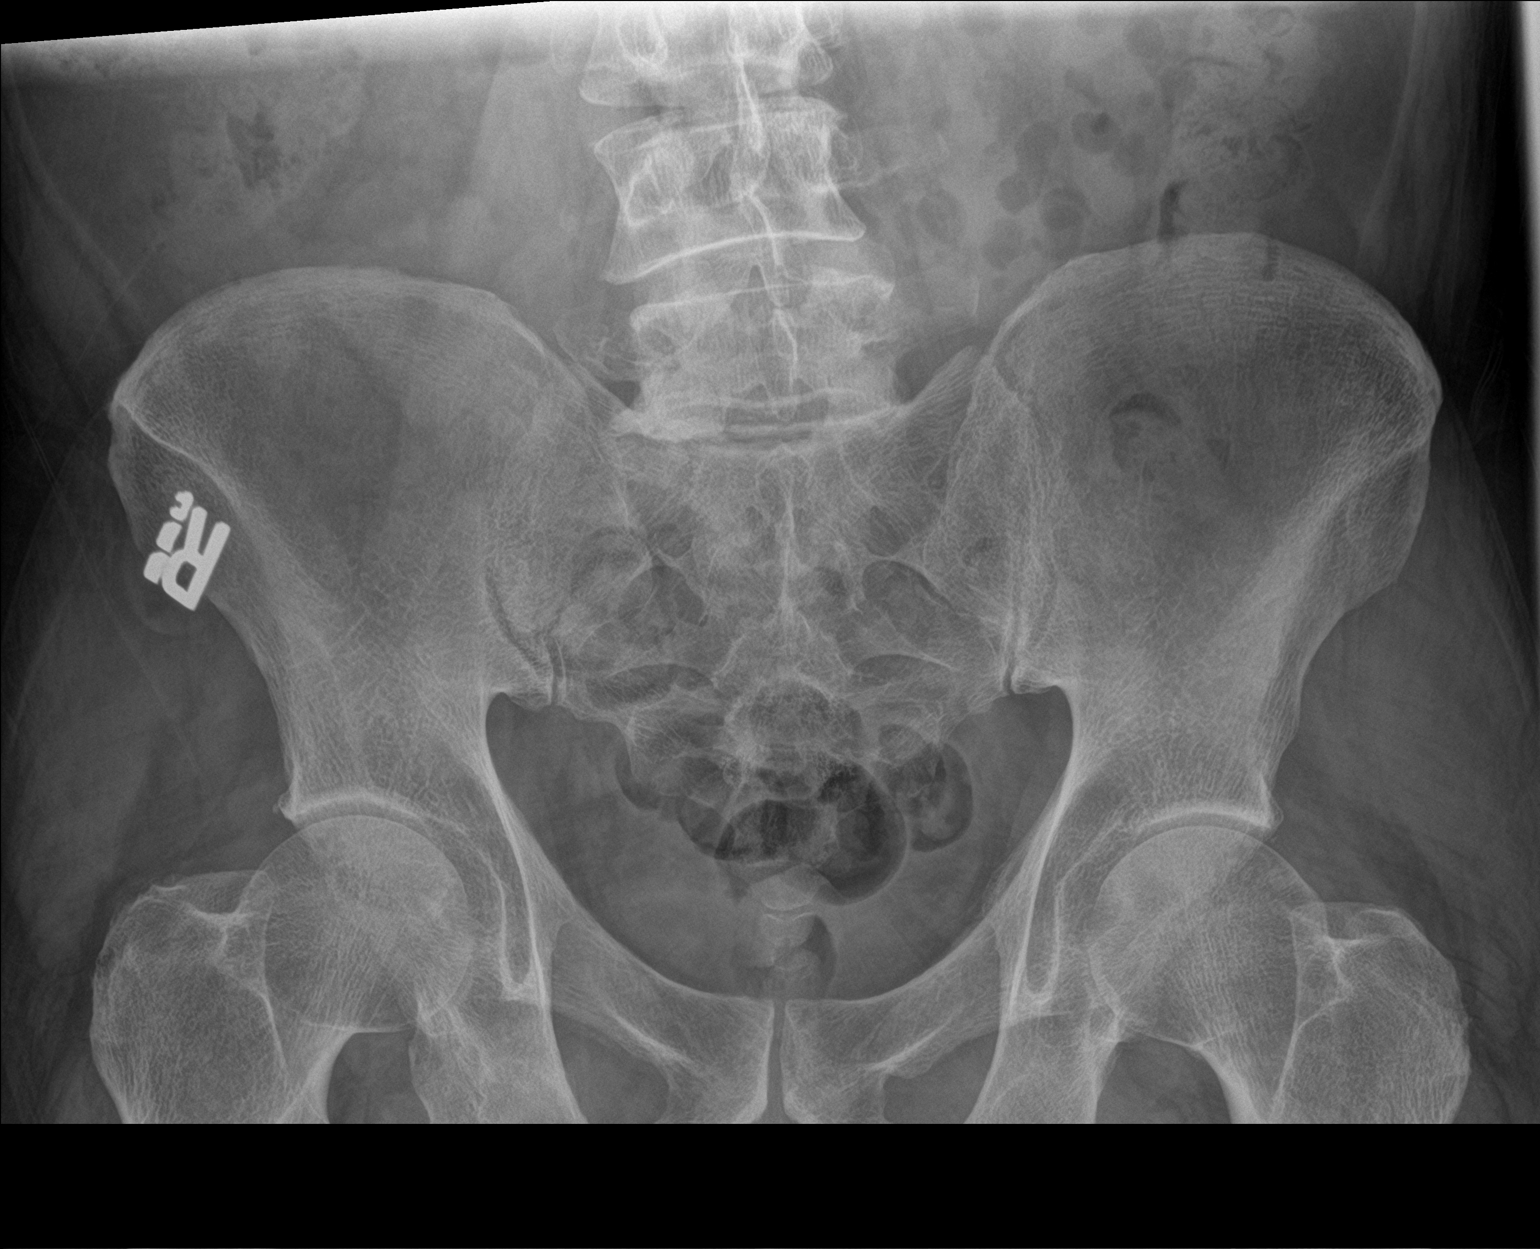

[2 of 2 positions shown; findings below may reference images not displayed]

FINDINGS: Supine abdomen obtained. No abnormal calcifications are evident.
There is moderate stool in the colon. There is no bowel dilatation
or air-fluid level to suggest bowel obstruction. No free air. There
is lumbar dextroscoliosis.
IMPRESSION: No abnormal calcifications are evident by radiography. No bowel
obstruction or free air..

## 2019-08-23 IMAGING — US US RENAL
1 series · 14 of 25 positions shown · non-contrast
Comparison: Abdominal radiograph October 23, 2017

CLINICAL DATA: Evaluate LEFT ureteral stone.

EXAM:
RENAL / URINARY TRACT ULTRASOUND COMPLETE

[Series 1: us renal · 0.26mm/px · 14 of 33 slices shown]
[im 1/33]
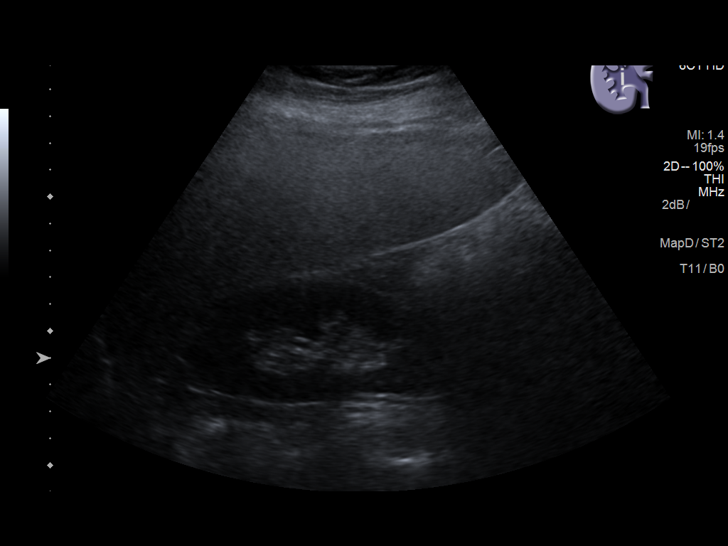
[im 3/33]
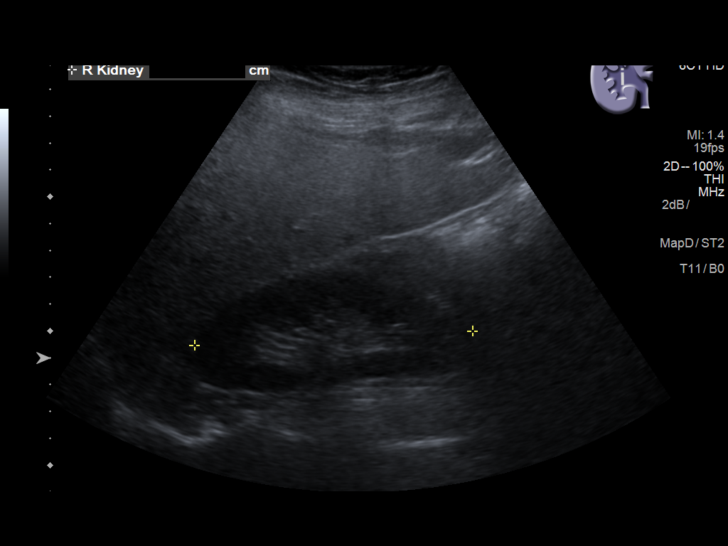
[im 6/33]
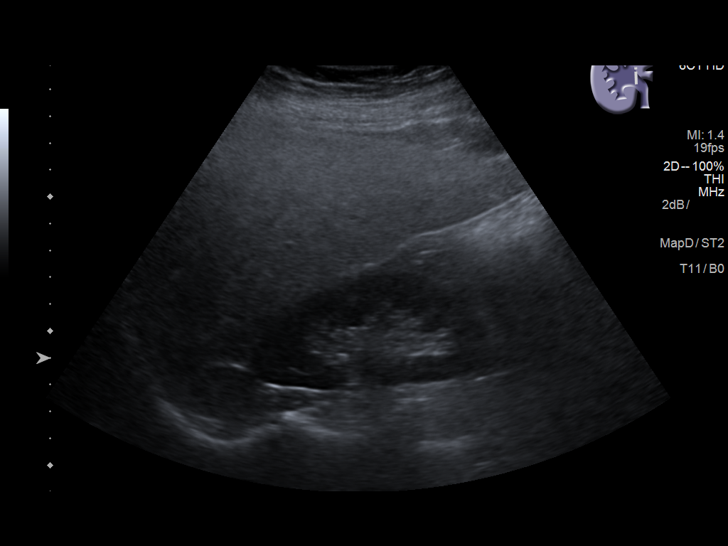
[im 9/33]
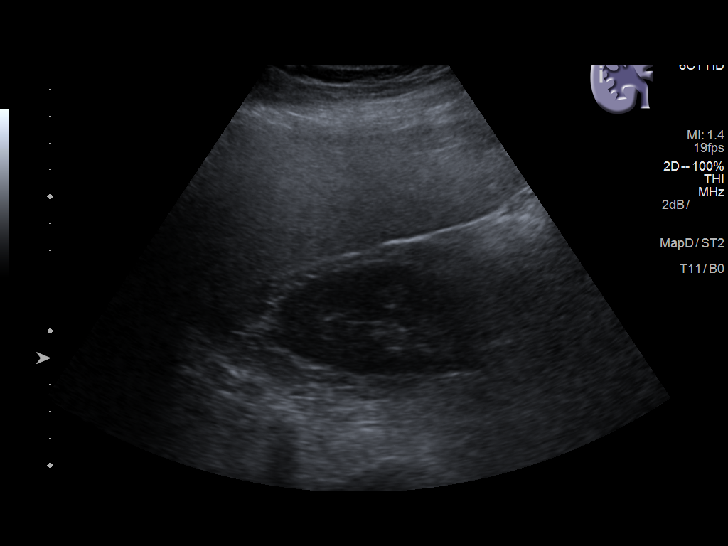
[im 11/33]
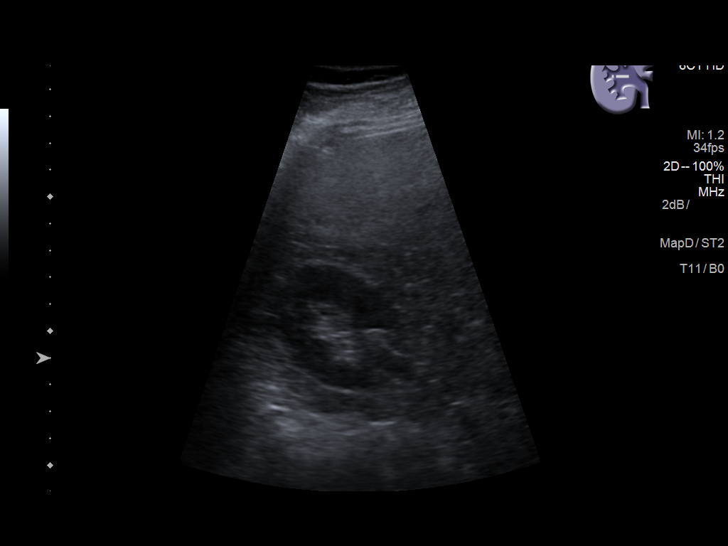
[im 13/33]
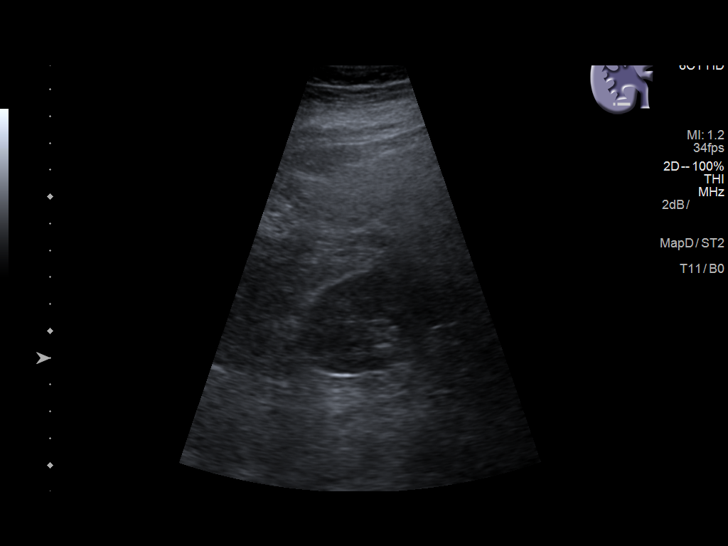
[im 15/33]
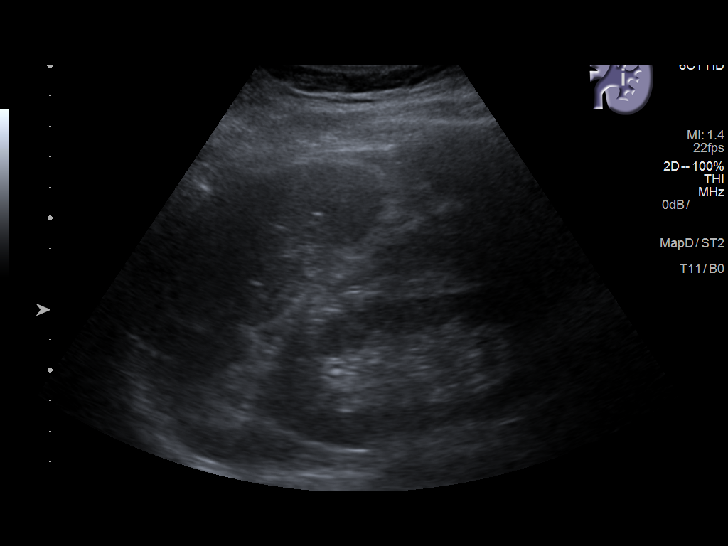
[im 18/33]
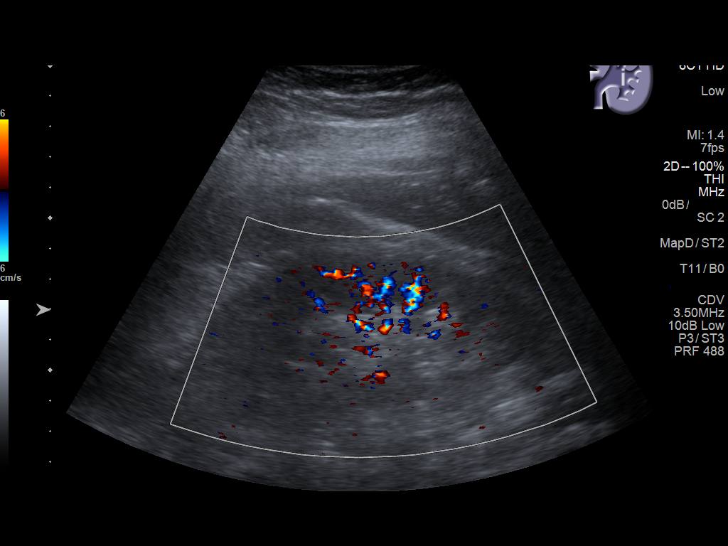
[im 21/33]
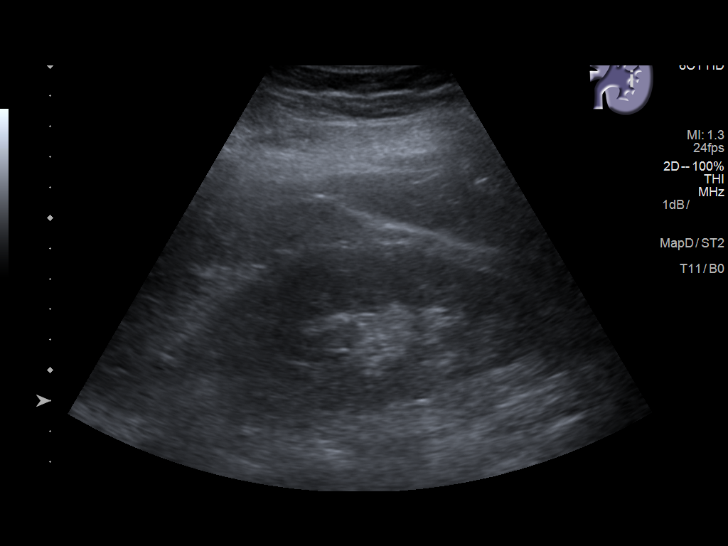
[im 22/33]
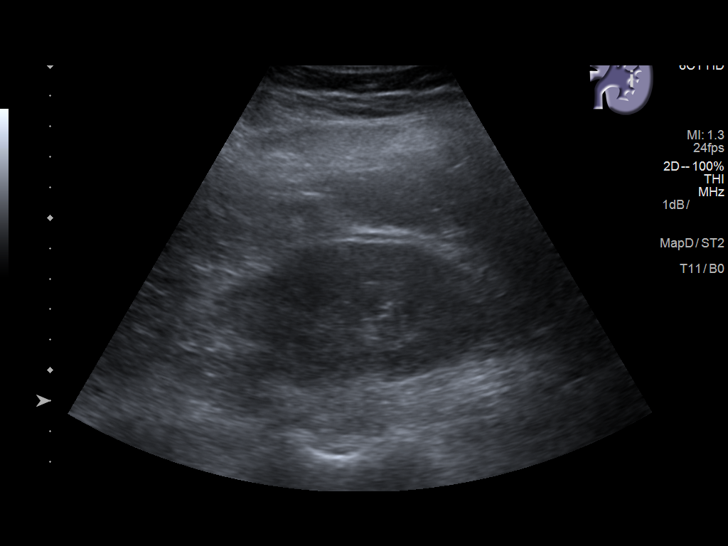
[im 25/33]
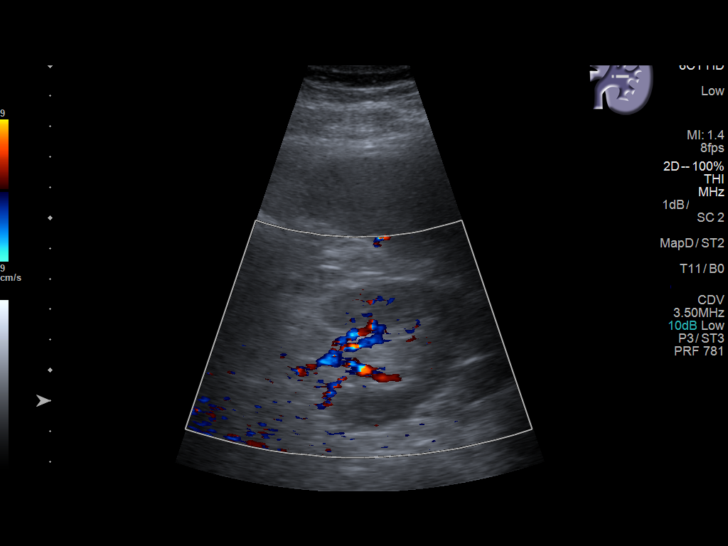
[im 27/33]
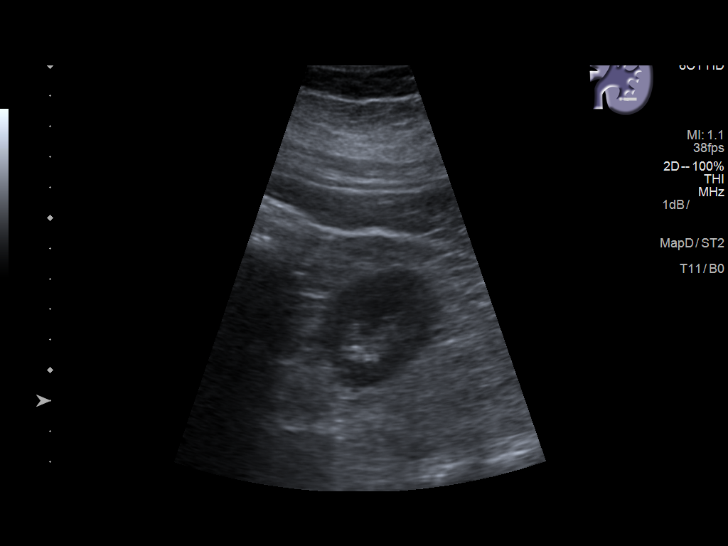
[im 30/33]
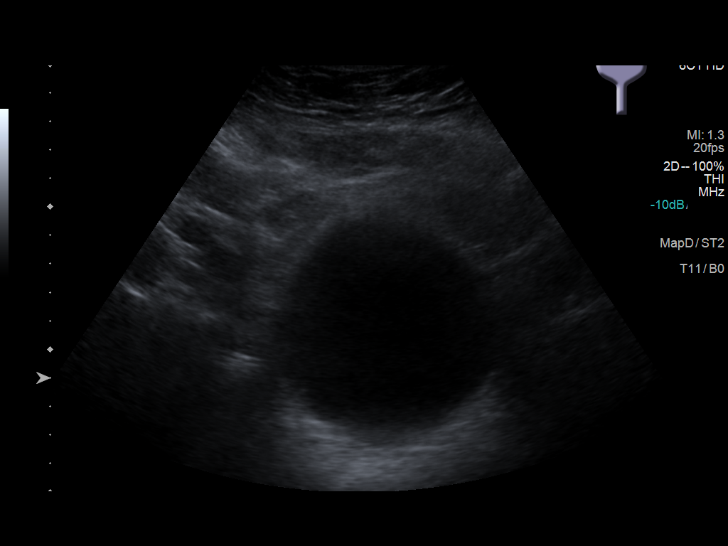
[im 33/33]
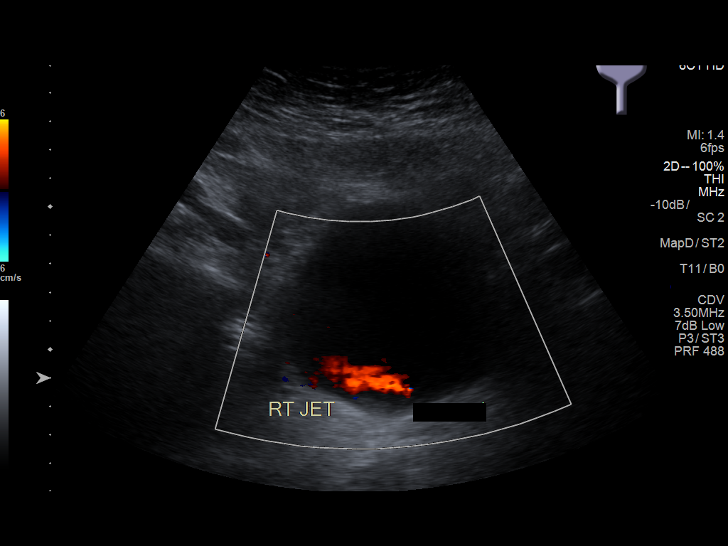

[14 of 25 positions shown; findings below may reference images not displayed]

FINDINGS: Right Kidney:

Length: 10.4 cm. Echogenicity within normal limits. No mass or
hydronephrosis visualized.

Left Kidney:

Length: 11.4 cm. Echogenicity within normal limits. No mass or
hydronephrosis visualized. No identified nephrolithiasis though CT
is more sensitive.

Bladder:

Appears normal for degree of bladder distention. Bilateral ureteral
jets documented.
IMPRESSION: 1. Normal renal ultrasound.
2. Hepatic steatosis.

## 2019-12-26 ENCOUNTER — Encounter: Payer: Self-pay | Admitting: Dermatology

## 2019-12-26 ENCOUNTER — Other Ambulatory Visit: Payer: Self-pay

## 2019-12-26 ENCOUNTER — Ambulatory Visit (INDEPENDENT_AMBULATORY_CARE_PROVIDER_SITE_OTHER): Payer: Medicare Other | Admitting: Dermatology

## 2019-12-26 DIAGNOSIS — L570 Actinic keratosis: Secondary | ICD-10-CM

## 2019-12-26 DIAGNOSIS — L820 Inflamed seborrheic keratosis: Secondary | ICD-10-CM

## 2019-12-26 DIAGNOSIS — D229 Melanocytic nevi, unspecified: Secondary | ICD-10-CM

## 2019-12-26 DIAGNOSIS — L814 Other melanin hyperpigmentation: Secondary | ICD-10-CM

## 2019-12-26 DIAGNOSIS — L719 Rosacea, unspecified: Secondary | ICD-10-CM

## 2019-12-26 DIAGNOSIS — B353 Tinea pedis: Secondary | ICD-10-CM

## 2019-12-26 DIAGNOSIS — D1801 Hemangioma of skin and subcutaneous tissue: Secondary | ICD-10-CM

## 2019-12-26 DIAGNOSIS — Z1283 Encounter for screening for malignant neoplasm of skin: Secondary | ICD-10-CM | POA: Diagnosis not present

## 2019-12-26 DIAGNOSIS — Z85828 Personal history of other malignant neoplasm of skin: Secondary | ICD-10-CM

## 2019-12-26 DIAGNOSIS — L578 Other skin changes due to chronic exposure to nonionizing radiation: Secondary | ICD-10-CM

## 2019-12-26 MED ORDER — DOXYCYCLINE HYCLATE 50 MG PO CAPS: 50.0000 mg | ORAL_CAPSULE | Freq: Every day | ORAL | 5 refills | Status: DC

## 2019-12-26 MED ORDER — TERBINAFINE HCL 250 MG PO TABS
250.0000 mg | ORAL_TABLET | Freq: Every day | ORAL | 0 refills | Status: DC
Start: 2019-12-26 — End: 2020-12-30

## 2019-12-26 NOTE — Progress Notes (Signed)
Follow-Up Visit   Subjective  Paul [name] is a 69 y.o. male who presents for the following: Annual Exam (Hx of BCC and AK's - patient has noticed skin lesions on his body he would like checked). Patient is using Doxycycline 100mg  po QD for rosacea which was prescribed by Dr. Jens Som. He has used Metronidazole in the past but didn't find it helpful.  The patient presents for Total-Body Skin Exam (TBSE) for skin cancer screening and mole check.  The following portions of the chart were reviewed this encounter and updated as appropriate:  Tobacco  Allergies  Meds  Problems  Med Hx  Surg Hx  Fam Hx      Review of Systems:  No other skin or systemic complaints except as noted in HPI or Assessment and Plan.  Objective  Well appearing patient in no apparent distress; mood and affect are within normal limits.  A full examination was performed including scalp, head, eyes, ears, nose, lips, neck, chest, axillae, abdomen, back, buttocks, bilateral upper extremities, bilateral lower extremities, hands, feet, fingers, toes, fingernails, and toenails. All findings within normal limits unless otherwise noted below.  Objective  Face: Mid face erythema with telangiectasias +/- scattered inflammatory papules.   Objective  Left Ear: Erythematous thin papules/macules with gritty scale.   Objective  L forearm x 2, back x 2, L thigh x 1, L waistline x 1 (6): Erythematous keratotic or waxy stuck-on papule or plaque.   Objective  Right Foot - Anterior: Peeling and scale    Assessment & Plan  Rosacea Face  Advised patient that condition is not curable and is chronic/progressive. Continue Doxycycline but decrease dosage to 50mg  po QD. Doxycycline should be taken with food to prevent nausea. Do not lay down for 30 minutes after taking. Be cautious with sun exposure and use good sun protection while on this medication. Start [facility] mix QHS.   doxycycline (VIBRAMYCIN) 50 MG capsule -  Face  AK (actinic keratosis) Left Ear  Destruction of lesion - Left Ear Complexity: simple   Destruction method: cryotherapy   Informed consent: discussed and consent obtained   Timeout:  patient name, date of birth, surgical site, and procedure verified Lesion destroyed using liquid nitrogen: Yes   Region frozen until ice ball extended beyond lesion: Yes   Outcome: patient tolerated procedure well with no complications   Post-procedure details: wound care instructions given    Inflamed seborrheic keratosis (6) L forearm x 2, back x 2, L thigh x 1, L waistline x 1  Destruction of lesion - L forearm x 2, back x 2, L thigh x 1, L waistline x 1 Complexity: simple   Destruction method: cryotherapy   Informed consent: discussed and consent obtained   Timeout:  patient name, date of birth, surgical site, and procedure verified Lesion destroyed using liquid nitrogen: Yes   Region frozen until ice ball extended beyond lesion: Yes   Outcome: patient tolerated procedure well with no complications   Post-procedure details: wound care instructions given    Tinea pedis of right foot Right Foot - Anterior  Start Terbinafine 250mg  po QD #30 0RF - no history of liver issues or taking cholesterol medication  terbinafine (LAMISIL) 250 MG tablet - Right Foot - Anterior  Skin cancer screening   Lentigines - Scattered tan macules - Discussed due to sun exposure - Benign, observe - Call for any changes  Seborrheic Keratoses - Stuck-on, waxy, tan-brown papules and plaques  - Discussed benign  etiology and prognosis. - Observe - Call for any changes  Melanocytic Nevi - Tan-brown and/or pink-flesh-colored symmetric macules and papules - Benign appearing on exam today - Observation - Call clinic for new or changing moles - Recommend daily use of broad spectrum spf 30+ sunscreen to sun-exposed areas.   Hemangiomas - Red papules - Discussed benign nature - Observe - Call for any  changes  Actinic Damage - diffuse scaly erythematous macules with underlying dyspigmentation - Recommend daily broad spectrum sunscreen SPF 30+ to sun-exposed areas, reapply every 2 hours as needed.  - Call for new or changing lesions.  History of Basal Cell Carcinoma of the Skin - No evidence of recurrence today - Recommend regular full body skin exams - Recommend daily broad spectrum sunscreen SPF 30+ to sun-exposed areas, reapply every 2 hours as needed.  - Call if any new or changing lesions are noted between office visits   Skin cancer screening performed today.   Return in about 1 year (around 12/25/2020) for TBSE.  Luther Redo, CMA, am acting as scribe for Sarina Ser, MD .  Documentation: I have reviewed the above documentation for accuracy and completeness, and I agree with the above.  Sarina Ser, MD

## 2019-12-26 NOTE — Patient Instructions (Signed)
Doxycycline should be taken with food to prevent nausea. Do not lay down for 30 minutes after taking. Be cautious with sun exposure and use good sun protection while on this medication.   

## 2019-12-30 ENCOUNTER — Encounter: Payer: Self-pay | Admitting: Dermatology

## 2020-06-18 ENCOUNTER — Other Ambulatory Visit: Payer: Self-pay | Admitting: Dermatology

## 2020-06-18 DIAGNOSIS — B353 Tinea pedis: Secondary | ICD-10-CM

## 2020-12-30 ENCOUNTER — Other Ambulatory Visit: Payer: Self-pay

## 2020-12-30 ENCOUNTER — Ambulatory Visit (INDEPENDENT_AMBULATORY_CARE_PROVIDER_SITE_OTHER): Payer: Medicare Other | Admitting: Dermatology

## 2020-12-30 DIAGNOSIS — L719 Rosacea, unspecified: Secondary | ICD-10-CM

## 2020-12-30 DIAGNOSIS — L814 Other melanin hyperpigmentation: Secondary | ICD-10-CM

## 2020-12-30 DIAGNOSIS — Z85828 Personal history of other malignant neoplasm of skin: Secondary | ICD-10-CM

## 2020-12-30 DIAGNOSIS — L578 Other skin changes due to chronic exposure to nonionizing radiation: Secondary | ICD-10-CM

## 2020-12-30 DIAGNOSIS — B353 Tinea pedis: Secondary | ICD-10-CM | POA: Diagnosis not present

## 2020-12-30 DIAGNOSIS — D229 Melanocytic nevi, unspecified: Secondary | ICD-10-CM

## 2020-12-30 DIAGNOSIS — Z1283 Encounter for screening for malignant neoplasm of skin: Secondary | ICD-10-CM

## 2020-12-30 DIAGNOSIS — L570 Actinic keratosis: Secondary | ICD-10-CM

## 2020-12-30 DIAGNOSIS — D1800 Hemangioma unspecified site: Secondary | ICD-10-CM

## 2020-12-30 DIAGNOSIS — L821 Other seborrheic keratosis: Secondary | ICD-10-CM

## 2020-12-30 DIAGNOSIS — L820 Inflamed seborrheic keratosis: Secondary | ICD-10-CM

## 2020-12-30 MED ORDER — KETOCONAZOLE 2 % EX CREA: TOPICAL_CREAM | CUTANEOUS | 6 refills | Status: DC

## 2020-12-30 MED ORDER — DOXYCYCLINE HYCLATE 50 MG PO CAPS: 50.0000 mg | ORAL_CAPSULE | Freq: Every day | ORAL | 5 refills | Status: DC

## 2020-12-30 NOTE — Patient Instructions (Signed)
Instructions for [facility] Medications Cream for Rosacea, please call this pharmacy One or more of your medications was sent to the [facility] mail order compounding pharmacy. You will receive an email from them and can purchase the medicine through that link. It will then be mailed to your home at the address you confirmed. If for any reason you do not receive an email from them, please check your spam folder. If you still do not find the email, please let us know. [facility] phone number is [phone].     If you have any questions or concerns for your doctor, please call our main line at [phone] and press option 4 to reach your doctor's medical assistant. If no one answers, please leave a voicemail as directed and we will return your call as soon as possible. Messages left after 4 pm will be answered the following business day.   You may also send [place] a message via [place]. We typically respond to MyChart messages within 1-2 business days.  For prescription refills, please ask your pharmacy to contact our office. Our fax number is [phone].  If you have an urgent issue when the clinic is closed that cannot wait until the next business day, you can page your doctor at the number below.    Please note that while we do our best to be available for urgent issues outside of office hours, we are not available 24/7.   If you have an urgent issue and are unable to reach [place], you may choose to seek medical care at your doctor's office, retail clinic, urgent care center, or emergency room.  If you have a medical emergency, please immediately call 911 or go to the emergency department.  Pager Numbers  - Dr. Nehemiah Massed: [phone]  - Dr. Laurence Ferrari: [phone]  - Dr. Nicole Kindred: [phone]  In the event of inclement weather, please call our main line at [phone] for an update on the status of any delays or closures.  Dermatology Medication Tips: Please keep the boxes  that topical medications come in in order to help keep track of the instructions about where and how to use these. Pharmacies typically print the medication instructions only on the boxes and not directly on the medication tubes.   If your medication is too expensive, please contact our office at [phone] option 4 or send [place] a message through [place].   We are unable to tell what your co-pay for medications will be in advance as this is different depending on your insurance coverage. However, we may be able to find a substitute medication at lower cost or fill out paperwork to get insurance to cover a needed medication.   If a prior authorization is required to get your medication covered by your insurance company, please allow [place] 1-2 business days to complete this process.  Drug prices often vary depending on where the prescription is filled and some pharmacies may offer cheaper prices.  The website www.goodrx.com contains coupons for medications through different pharmacies. The prices here do not account for what the cost may be with help from insurance (it may be cheaper with your insurance), but the website can give you the price if you did not use any insurance.  - You can print the associated coupon and take it with your prescription to the pharmacy.  - You may also stop by our office during regular business hours and pick up a GoodRx coupon card.  - If you need your prescription sent  electronically to a different pharmacy, notify our office through [facility] or by phone at [phone] option 4.

## 2020-12-30 NOTE — Progress Notes (Signed)
Follow-Up Visit   Subjective  Paul [name] is a 70 y.o. male who presents for the following: Annual Exam (Mole check ). The patient presents for Total-Body Skin Exam (TBSE) for skin cancer screening and mole check.  The following portions of the chart were reviewed this encounter and updated as appropriate:   Tobacco  Allergies  Meds  Problems  Med Hx  Surg Hx  Fam Hx      Review of Systems:  No other skin or systemic complaints except as noted in HPI or Assessment and Plan.  Objective  Well appearing patient in no apparent distress; mood and affect are within normal limits.  A full examination was performed including scalp, head, eyes, ears, nose, lips, neck, chest, axillae, abdomen, back, buttocks, bilateral upper extremities, bilateral lower extremities, hands, feet, fingers, toes, fingernails, and toenails. All findings within normal limits unless otherwise noted below.  feet Scaling and maceration web spaces and over distal and lateral soles.   face x 3 (3) Erythematous keratotic or waxy stuck-on papule or plaque.   face x 6 (6) Erythematous thin papules/macules with gritty scale.   face Mid face erythema with telangiectasias +/- scattered inflammatory papules.    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  Ketoconazole cream nightly Related Medications ketoconazole (NIZORAL) 2 % cream Apply to feet at bedtime  Inflamed seborrheic keratosis face x 3  Related Procedures Destruction of lesion Complexity: simple   Destruction method: cryotherapy   Informed consent: discussed and consent obtained   Timeout:  patient name, date of birth, surgical site, and procedure verified Lesion destroyed using liquid nitrogen: Yes   Region frozen until ice ball extended beyond lesion: Yes   Outcome: patient tolerated procedure well with no complications   Post-procedure details: wound care instructions given    AK (actinic keratosis)  (6) face x 6  Destruction of lesion - face x 6 Complexity: simple   Destruction method: cryotherapy   Informed consent: discussed and consent obtained   Timeout:  patient name, date of birth, surgical site, and procedure verified Lesion destroyed using liquid nitrogen: Yes   Region frozen until ice ball extended beyond lesion: Yes   Outcome: patient tolerated procedure well with no complications   Post-procedure details: wound care instructions given    Rosacea Face Doxycycline 50 mg Q p.m. with food  Rosacea is a chronic progressive skin condition usually affecting the face of adults, causing redness and/or acne bumps. It is treatable but not curable. It sometimes affects the eyes (ocular rosacea) as well. It may respond to topical and/or systemic medication and can flare with stress, sun exposure, alcohol, exercise and some foods.  Daily application of broad spectrum spf 30+ sunscreen to face is recommended to reduce flares.  Related Medications doxycycline (VIBRAMYCIN) 50 MG capsule Take 1 capsule (50 mg total) by mouth daily.  Lentigines - Scattered tan macules - Due to sun exposure - Benign-appering, observe - Recommend daily broad spectrum sunscreen SPF 30+ to sun-exposed areas, reapply every 2 hours as needed. - Call for any changes  Seborrheic Keratoses - Stuck-on, waxy, tan-brown papules and/or plaques  - Benign-appearing - Discussed benign etiology and prognosis. - Observe - Call for any changes  Melanocytic Nevi - Tan-brown and/or pink-flesh-colored symmetric macules and papules - Benign appearing on exam today - Observation - Call clinic for new or changing moles - Recommend daily use of broad spectrum spf 30+ sunscreen to sun-exposed areas.  Hemangiomas - Red papules - Discussed benign nature - Observe - Call for any changes  Actinic Damage - Chronic condition, secondary to cumulative UV/sun exposure - diffuse scaly erythematous macules with  underlying dyspigmentation - Recommend daily broad spectrum sunscreen SPF 30+ to sun-exposed areas, reapply every 2 hours as needed.  - Staying in the shade or wearing long sleeves, sun glasses (UVA+UVB protection) and wide brim hats (4-inch brim around the entire circumference of the hat) are also recommended for sun protection.  - Call for new or changing lesions.  History of Basal Cell Carcinoma of the Skin Right upper back medial to sup scapula 2019 - No evidence of recurrence today - Recommend regular full body skin exams - Recommend daily broad spectrum sunscreen SPF 30+ to sun-exposed areas, reapply every 2 hours as needed.  - Call if any new or changing lesions are noted between office visits  Skin cancer screening performed today.   Return in about 1 year (around 12/30/2021) for TBSE, Hx of BCC.  IMarye Round, CMA, am acting as scribe for Sarina Ser, MD .  Documentation: I have reviewed the above documentation for accuracy and completeness, and I agree with the above.  Sarina Ser, MD

## 2021-01-06 ENCOUNTER — Encounter: Payer: Self-pay | Admitting: Dermatology

## 2021-12-30 ENCOUNTER — Ambulatory Visit (INDEPENDENT_AMBULATORY_CARE_PROVIDER_SITE_OTHER): Payer: Medicare Other | Admitting: Dermatology

## 2021-12-30 DIAGNOSIS — L820 Inflamed seborrheic keratosis: Secondary | ICD-10-CM

## 2021-12-30 DIAGNOSIS — D2239 Melanocytic nevi of other parts of face: Secondary | ICD-10-CM

## 2021-12-30 DIAGNOSIS — L918 Other hypertrophic disorders of the skin: Secondary | ICD-10-CM | POA: Diagnosis not present

## 2021-12-30 DIAGNOSIS — D1800 Hemangioma unspecified site: Secondary | ICD-10-CM

## 2021-12-30 DIAGNOSIS — L578 Other skin changes due to chronic exposure to nonionizing radiation: Secondary | ICD-10-CM | POA: Diagnosis not present

## 2021-12-30 DIAGNOSIS — L719 Rosacea, unspecified: Secondary | ICD-10-CM | POA: Diagnosis not present

## 2021-12-30 DIAGNOSIS — L570 Actinic keratosis: Secondary | ICD-10-CM

## 2021-12-30 DIAGNOSIS — B353 Tinea pedis: Secondary | ICD-10-CM | POA: Diagnosis not present

## 2021-12-30 DIAGNOSIS — L821 Other seborrheic keratosis: Secondary | ICD-10-CM

## 2021-12-30 DIAGNOSIS — D229 Melanocytic nevi, unspecified: Secondary | ICD-10-CM

## 2021-12-30 DIAGNOSIS — L814 Other melanin hyperpigmentation: Secondary | ICD-10-CM

## 2021-12-30 DIAGNOSIS — Z85828 Personal history of other malignant neoplasm of skin: Secondary | ICD-10-CM

## 2021-12-30 MED ORDER — KETOCONAZOLE 2 % EX CREA: 1.0000 | TOPICAL_CREAM | Freq: Every day | CUTANEOUS | 4 refills | Status: DC

## 2021-12-30 MED ORDER — DOXYCYCLINE HYCLATE 50 MG PO CAPS: 50.0000 mg | ORAL_CAPSULE | Freq: Every day | ORAL | 4 refills | Status: DC

## 2021-12-30 NOTE — Patient Instructions (Addendum)
Instructions for [facility] Medications  One or more of your medications was sent to the [facility] mail order compounding pharmacy. You will receive an email from them and can purchase the medicine through that link. It will then be mailed to your home at the address you confirmed. If for any reason you do not receive an email from them, please check your spam folder. If you still do not find the email, please let us know. [facility] phone number is [phone].    Cryotherapy Aftercare  Wash gently with soap and water everyday.   Apply Vaseline and Band-Aid daily until healed.   Due to recent changes in healthcare laws, you may see results of your pathology and/or laboratory studies on MyChart before the doctors have had a chance to review them. We understand that in some cases there may be results that are confusing or concerning to you. Please understand that not all results are received at the same time and often the doctors may need to interpret multiple results in order to provide you with the best plan of care or course of treatment. Therefore, we ask that you please give us 2 business days to thoroughly review all your results before contacting the office for clarification. Should we see a critical lab result, you will be contacted sooner.   If You Need Anything After Your Visit  If you have any questions or concerns for your doctor, please call our main line at [phone] and press option 4 to reach your doctor's medical assistant. If no one answers, please leave a voicemail as directed and we will return your call as soon as possible. Messages left after 4 pm will be answered the following business day.   You may also send us a message via MyChart. We typically respond to MyChart messages within 1-2 business days.  For prescription refills, please ask your pharmacy to contact our office. Our fax number is [phone].  If you have an urgent issue when the clinic is  closed that cannot wait until the next business day, you can page your doctor at the number below.    Please note that while we do our best to be available for urgent issues outside of office hours, we are not available 24/7.   If you have an urgent issue and are unable to reach us, you may choose to seek medical care at your doctor's office, retail clinic, urgent care center, or emergency room.  If you have a medical emergency, please immediately call 911 or go to the emergency department.  Pager Numbers  - Dr. Kowalski: [phone]  - Dr. Moye: [phone]  - Dr. Stewart: [phone]  In the event of inclement weather, please call our main line at [phone] for an update on the status of any delays or closures.  Dermatology Medication Tips: Please keep the boxes that topical medications come in in order to help keep track of the instructions about where and how to use these. Pharmacies typically print the medication instructions only on the boxes and not directly on the medication tubes.   If your medication is too expensive, please contact our office at [phone] option 4 or send us a message through MyChart.   We are unable to tell what your co-pay for medications will be in advance as this is different depending on your insurance coverage. However, we may be able to find a substitute medication at lower cost or fill out paperwork to get insurance to cover a   needed medication.   If a prior authorization is required to get your medication covered by your insurance company, please allow us 1-2 business days to complete this process.  Drug prices often vary depending on where the prescription is filled and some pharmacies may offer cheaper prices.  The website www.goodrx.com contains coupons for medications through different pharmacies. The prices here do not account for what the cost may be with help from insurance (it may be cheaper with your insurance), but the website can  give you the price if you did not use any insurance.  - You can print the associated coupon and take it with your prescription to the pharmacy.  - You may also stop by our office during regular business hours and pick up a GoodRx coupon card.  - If you need your prescription sent electronically to a different pharmacy, notify our office through [facility] MyChart or by phone at [phone] option 4.     Si Usted Necesita Algo Despus de Su Visita  Tambin puede enviarnos un mensaje a travs de MyChart. Por lo general respondemos a los mensajes de MyChart en el transcurso de 1 a 2 das hbiles.  Para renovar recetas, por favor pida a su farmacia que se ponga en contacto con nuestra oficina. Nuestro nmero de fax es el [phone].  Si tiene un asunto urgente cuando la clnica est cerrada y que no puede esperar hasta el siguiente da hbil, puede llamar/localizar a su doctor(a) al nmero que aparece a continuacin.   Por favor, tenga en cuenta que aunque hacemos todo lo posible para estar disponibles para asuntos urgentes fuera del horario de oficina, no estamos disponibles las 24 horas del da, los 7 das de la semana.   Si tiene un problema urgente y no puede comunicarse con nosotros, puede optar por buscar atencin mdica  en el consultorio de su doctor(a), en una clnica privada, en un centro de atencin urgente o en una sala de emergencias.  Si tiene una emergencia mdica, por favor llame inmediatamente al 911 o vaya a la sala de emergencias.  Nmeros de bper  - Dr. Kowalski: [phone]  - Dra. Moye: [phone]  - Dra. Stewart: [phone]  En caso de inclemencias del tiempo, por favor llame a nuestra lnea principal al [phone] para una actualizacin sobre el estado de cualquier retraso o cierre.  Consejos para la medicacin en dermatologa: Por favor, guarde las cajas en las que vienen los medicamentos de uso tpico para ayudarle a seguir las instrucciones sobre  dnde y cmo usarlos. Las farmacias generalmente imprimen las instrucciones del medicamento slo en las cajas y no directamente en los tubos del medicamento.   Si su medicamento es muy caro, por favor, pngase en contacto con nuestra oficina llamando al [phone] y presione la opcin 4 o envenos un mensaje a travs de MyChart.   No podemos decirle cul ser su copago por los medicamentos por adelantado ya que esto es diferente dependiendo de la cobertura de su seguro. Sin embargo, es posible que podamos encontrar un medicamento sustituto a menor costo o llenar un formulario para que el seguro cubra el medicamento que se considera necesario.   Si se requiere una autorizacin previa para que su compaa de seguros cubra su medicamento, por favor permtanos de 1 a 2 das hbiles para completar este proceso.  Los precios de los medicamentos varan con frecuencia dependiendo del lugar de dnde se surte la receta y alguna farmacias pueden ofrecer precios ms baratos.    El sitio web www.goodrx.com tiene cupones para medicamentos de diferentes farmacias. Los precios aqu no tienen en cuenta lo que podra costar con la ayuda del seguro (puede ser ms barato con su seguro), pero el sitio web puede darle el precio si no utiliz ningn seguro.  - Puede imprimir el cupn correspondiente y llevarlo con su receta a la farmacia.  - Tambin puede pasar por nuestra oficina durante el horario de atencin regular y recoger una tarjeta de cupones de GoodRx.  - Si necesita que su receta se enve electrnicamente a una farmacia diferente, informe a nuestra oficina a travs de MyChart de [place] o por telfono llamando al [phone] y presione la opcin 4.  

## 2021-12-30 NOTE — Progress Notes (Unsigned)
Follow-Up Visit   Subjective  Paul [name] is a 71 y.o. male who presents for the following: check spots (R arm, ~8 months, no symptoms/L temple, ~2 wks, no symptoms/Skin tags L axilla, R eyelid, not sure how long they have been there/Mole L eyelid, yrs, no changes/Bump, R nasal bridge, 63m pt pierced with needle and drained it), Total body skin exam (Hx of BCC R upper back medial to sup scapula, AKs), Rosacea (Face, Doxycycline '50mg'$  prn flares, [facility] Rosacea-Oxymetazoline prn), and Tinea Pedis (Feet, Ketoconazole 2% prn flares). The patient presents for Total-Body Skin Exam (TBSE) for skin cancer screening and mole check.  The patient has spots, moles and lesions to be evaluated, some may be new or changing and the patient has concerns that these could be cancer.   The following portions of the chart were reviewed this encounter and updated as appropriate:   Tobacco  Allergies  Meds  Problems  Med Hx  Surg Hx  Fam Hx     Review of Systems:  No other skin or systemic complaints except as noted in HPI or Assessment and Plan.  Objective  Well appearing patient in no apparent distress; mood and affect are within normal limits.  A full examination was performed including scalp, head, eyes, ears, nose, lips, neck, chest, axillae, abdomen, back, buttocks, bilateral upper extremities, bilateral lower extremities, hands, feet, fingers, toes, fingernails, and toenails. All findings within normal limits unless otherwise noted below.  face Paps face, erythema face  Right Foot - Posterior Scaling and maceration web spaces and over distal and lateral soles.   R forehead x 1 Pink scaly macules  R wrist x 2, R forearm x 1, L infraorbital x 1 (4) Stuck on waxy paps with erythema  Right lower eyelid margin x 1 Stuck on waxy paps with erythema   Left Axilla x 1 Fleshy, skin-colored pedunculated papules.     Assessment & Plan   Lentigines - Scattered tan macules - Due to  sun exposure - Benign-appearing, observe - Recommend daily broad spectrum sunscreen SPF 30+ to sun-exposed areas, reapply every 2 hours as needed. - Call for any changes  Seborrheic Keratoses - Stuck-on, waxy, tan-brown papules and/or plaques  - Benign-appearing - Discussed benign etiology and prognosis. - Observe - Call for any changes  Melanocytic Nevi - Tan-brown and/or pink-flesh-colored symmetric macules and papules - Benign appearing on exam today - Observation - Call clinic for new or changing moles - Recommend daily use of broad spectrum spf 30+ sunscreen to sun-exposed areas.   Hemangiomas - Red papules - Discussed benign nature - Observe - Call for any changes  Actinic Damage - Chronic condition, secondary to cumulative UV/sun exposure - diffuse scaly erythematous macules with underlying dyspigmentation - Recommend daily broad spectrum sunscreen SPF 30+ to sun-exposed areas, reapply every 2 hours as needed.  - Staying in the shade or wearing long sleeves, sun glasses (UVA+UVB protection) and wide brim hats (4-inch brim around the entire circumference of the hat) are also recommended for sun protection.  - Call for new or changing lesions.  Skin cancer screening performed today.  History of Basal Cell Carcinoma of the Skin - No evidence of recurrence today - Recommend regular full body skin exams - Recommend daily broad spectrum sunscreen SPF 30+ to sun-exposed areas, reapply every 2 hours as needed.  - Call if any new or changing lesions are noted between office visits  - R upper back medial to sup scapula  Papule L sideburn - Acne - If does not resolve after restarting Doxycycline daily, RTC  Rosacea face Rosacea is a chronic progressive skin condition usually affecting the face of adults, causing redness and/or acne bumps. It is treatable but not curable. It sometimes affects the eyes (ocular rosacea) as well. It may respond to topical and/or systemic  medication and can flare with stress, sun exposure, alcohol, exercise and some foods.  Daily application of broad spectrum spf 30+ sunscreen to face is recommended to reduce flares.  Cont Doxycycline '50mg'$  1 po qd with food and drink Cont [facility] Rosacea-Oxymetazoline increase to qhs   Doxycycline should be taken with food to prevent nausea. Do not lay down for 30 minutes after taking. Be cautious with sun exposure and use good sun protection while on this medication. Pregnant women should not take this medication.    Related Medications doxycycline (VIBRAMYCIN) 50 MG capsule Take 1 capsule (50 mg total) by mouth daily. 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 Condition is symptomatic / bothersome to patient. Not to goal.  Cont Ketoconazole 2% cr qhs to feet  ketoconazole (NIZORAL) 2 % cream - Right Foot - Posterior Apply 1 Application topically at bedtime. Qhs to feet  AK (actinic keratosis) R forehead x 1 Destruction of lesion - R forehead x 1 Complexity: simple   Destruction method: cryotherapy   Informed consent: discussed and consent obtained   Timeout:  patient name, date of birth, surgical site, and procedure verified Lesion destroyed using liquid nitrogen: Yes   Region frozen until ice ball extended beyond lesion: Yes   Outcome: patient tolerated procedure well with no complications   Post-procedure details: wound care instructions given    Inflamed seborrheic keratosis (4) R wrist x 2, R forearm x 1, L infraorbital x 1 Symptomatic, irritating, patient would like treated.   Destruction of lesion - R wrist x 2, R forearm x 1, L infraorbital x 1 Complexity: simple   Destruction method: cryotherapy   Informed consent: discussed and consent obtained   Timeout:  patient name, date of birth, surgical site, and procedure verified Lesion destroyed using  liquid nitrogen: Yes   Region frozen until ice ball extended beyond lesion: Yes   Outcome: patient tolerated procedure well with no complications   Post-procedure details: wound care instructions given    Seborrheic keratosis, inflamed Right lower eyelid margin x 1 Symptomatic, irritating, patient would like treated.  Destruction of lesion - Right lower eyelid margin x 1 Complexity: simple   Destruction method: cryotherapy   Informed consent: discussed and consent obtained   Timeout:  patient name, date of birth, surgical site, and procedure verified Lesion destroyed using liquid nitrogen: Yes   Region frozen until ice ball extended beyond lesion: Yes   Outcome: patient tolerated procedure well with no complications   Post-procedure details: wound care instructions given    Skin tag Left Axilla x 1 Irritated  Destruction of lesion - Left Axilla x 1 Complexity: simple   Destruction method: cryotherapy   Informed consent: discussed and consent obtained   Timeout:  patient name, date of birth, surgical site, and procedure verified Lesion destroyed using liquid nitrogen: Yes   Region frozen until ice ball extended beyond lesion: Yes   Outcome: patient tolerated procedure well with no complications   Post-procedure details: wound care instructions given    Return in  about 1 year (around 12/31/2022) for TBSE, Hx of BCC, Hx of AKs, Rosacea, Tinea Pedis.  I, Othelia Pulling, RMA, am acting as scribe for Sarina Ser, MD . Documentation: I have reviewed the above documentation for accuracy and completeness, and I agree with the above.  Sarina Ser, MD

## 2022-01-04 ENCOUNTER — Encounter: Payer: Self-pay | Admitting: Dermatology

## 2022-02-24 ENCOUNTER — Ambulatory Visit (INDEPENDENT_AMBULATORY_CARE_PROVIDER_SITE_OTHER): Payer: Medicare Other | Admitting: Dermatology

## 2022-02-24 DIAGNOSIS — L821 Other seborrheic keratosis: Secondary | ICD-10-CM | POA: Diagnosis not present

## 2022-02-24 DIAGNOSIS — R21 Rash and other nonspecific skin eruption: Secondary | ICD-10-CM

## 2022-02-24 DIAGNOSIS — L578 Other skin changes due to chronic exposure to nonionizing radiation: Secondary | ICD-10-CM

## 2022-02-24 DIAGNOSIS — L820 Inflamed seborrheic keratosis: Secondary | ICD-10-CM | POA: Diagnosis not present

## 2022-02-24 NOTE — Progress Notes (Signed)
   Follow-Up Visit   Subjective  Paul [name] is a 71 y.o. male who presents for the following: Other (Spot of left forearm that is irritating.) The patient has spots, moles and lesions to be evaluated, some may be new or changing and the patient has concerns that these could be cancer.   The following portions of the chart were reviewed this encounter and updated as appropriate:   Tobacco  Allergies  Meds  Problems  Med Hx  Surg Hx  Fam Hx     Review of Systems:  No other skin or systemic complaints except as noted in HPI or Assessment and Plan.  Objective  Well appearing patient in no apparent distress; mood and affect are within normal limits.  A focused examination was performed including face, left arm. Relevant physical exam findings are noted in the Assessment and Plan.  Left Forearm - Posterior Erythematous stuck-on, waxy papule or plaque  Left Temple Clear today   Assessment & Plan   Actinic Damage - chronic, secondary to cumulative UV radiation exposure/sun exposure over time - diffuse scaly erythematous macules with underlying dyspigmentation - Recommend daily broad spectrum sunscreen SPF 30+ to sun-exposed areas, reapply every 2 hours as needed.  - Recommend staying in the shade or wearing long sleeves, sun glasses (UVA+UVB protection) and wide brim hats (4-inch brim around the entire circumference of the hat). - Call for new or changing lesions.  Seborrheic Keratoses - Stuck-on, waxy, tan-brown papules and/or plaques  - Benign-appearing - Discussed benign etiology and prognosis. - Observe - Call for any changes  Inflamed seborrheic keratosis Left Forearm - Posterior  Destruction of lesion - Left Forearm - Posterior Complexity: simple   Destruction method: cryotherapy   Informed consent: discussed and consent obtained   Timeout:  patient name, date of birth, surgical site, and procedure verified Lesion destroyed using liquid nitrogen: Yes   Region  frozen until ice ball extended beyond lesion: Yes   Outcome: patient tolerated procedure well with no complications   Post-procedure details: wound care instructions given    Rash Left Temple Resolved prior to appointment  Seborrheic keratosis  Actinic skin damage  Return for Follow up as scheduled, TBSE.  I, Paul [name], CMA, am acting as scribe for Sarina Ser, MD . Documentation: I have reviewed the above documentation for accuracy and completeness, and I agree with the above.  Sarina Ser, MD

## 2022-02-24 NOTE — Patient Instructions (Signed)
Cryotherapy Aftercare  Wash gently with soap and water everyday.   Apply Vaseline and Band-Aid daily until healed.     Due to recent changes in healthcare laws, you may see results of your pathology and/or laboratory studies on MyChart before the doctors have had a chance to review them. We understand that in some cases there may be results that are confusing or concerning to you. Please understand that not all results are received at the same time and often the doctors may need to interpret multiple results in order to provide you with the best plan of care or course of treatment. Therefore, we ask that you please give us 2 business days to thoroughly review all your results before contacting the office for clarification. Should we see a critical lab result, you will be contacted sooner.   If You Need Anything After Your Visit  If you have any questions or concerns for your doctor, please call our main line at [phone] and press option 4 to reach your doctor's medical assistant. If no one answers, please leave a voicemail as directed and we will return your call as soon as possible. Messages left after 4 pm will be answered the following business day.   You may also send us a message via MyChart. We typically respond to MyChart messages within 1-2 business days.  For prescription refills, please ask your pharmacy to contact our office. Our fax number is [phone].  If you have an urgent issue when the clinic is closed that cannot wait until the next business day, you can page your doctor at the number below.    Please note that while we do our best to be available for urgent issues outside of office hours, we are not available 24/7.   If you have an urgent issue and are unable to reach us, you may choose to seek medical care at your doctor's office, retail clinic, urgent care center, or emergency room.  If you have a medical emergency, please immediately call 911 or go to the  emergency department.  Pager Numbers  - Dr. Kowalski: [phone]  - Dr. Moye: [phone]  - Dr. Stewart: [phone]  In the event of inclement weather, please call our main line at [phone] for an update on the status of any delays or closures.  Dermatology Medication Tips: Please keep the boxes that topical medications come in in order to help keep track of the instructions about where and how to use these. Pharmacies typically print the medication instructions only on the boxes and not directly on the medication tubes.   If your medication is too expensive, please contact our office at [phone] option 4 or send us a message through MyChart.   We are unable to tell what your co-pay for medications will be in advance as this is different depending on your insurance coverage. However, we may be able to find a substitute medication at lower cost or fill out paperwork to get insurance to cover a needed medication.   If a prior authorization is required to get your medication covered by your insurance company, please allow us 1-2 business days to complete this process.  Drug prices often vary depending on where the prescription is filled and some pharmacies may offer cheaper prices.  The website www.goodrx.com contains coupons for medications through different pharmacies. The prices here do not account for what the cost may be with help from insurance (it may be cheaper with your insurance), but the website can   give you the price if you did not use any insurance.  - You can print the associated coupon and take it with your prescription to the pharmacy.  - You may also stop by our office during regular business hours and pick up a GoodRx coupon card.  - If you need your prescription sent electronically to a different pharmacy, notify our office through [facility] [place] MyChart or by phone at [phone] option 4.     Si Usted Necesita Algo Despus de Su Visita  Tambin puede  enviarnos un mensaje a travs de MyChart. Por lo general respondemos a los mensajes de MyChart en el transcurso de 1 a 2 das hbiles.  Para renovar recetas, por favor pida a su farmacia que se ponga en contacto con nuestra oficina. Nuestro nmero de fax es el [phone].  Si tiene un asunto urgente cuando la clnica est cerrada y que no puede esperar hasta el siguiente da hbil, puede llamar/localizar a su doctor(a) al nmero que aparece a continuacin.   Por favor, tenga en cuenta que aunque hacemos todo lo posible para estar disponibles para asuntos urgentes fuera del horario de oficina, no estamos disponibles las 24 horas del da, los 7 das de la semana.   Si tiene un problema urgente y no puede comunicarse con nosotros, puede optar por buscar atencin mdica  en el consultorio de su doctor(a), en una clnica privada, en un centro de atencin urgente o en una sala de emergencias.  Si tiene una emergencia mdica, por favor llame inmediatamente al 911 o vaya a la sala de emergencias.  Nmeros de bper  - Dr. Kowalski: [phone]  - Dra. Moye: [phone]  - Dra. Stewart: [phone]  En caso de inclemencias del tiempo, por favor llame a nuestra lnea principal al [phone] para una actualizacin sobre el estado de cualquier retraso o cierre.  Consejos para la medicacin en dermatologa: Por favor, guarde las cajas en las que vienen los medicamentos de uso tpico para ayudarle a seguir las instrucciones sobre dnde y cmo usarlos. Las farmacias generalmente imprimen las instrucciones del medicamento slo en las cajas y no directamente en los tubos del medicamento.   Si su medicamento es muy caro, por favor, pngase en contacto con nuestra oficina llamando al [phone] y presione la opcin 4 o envenos un mensaje a travs de MyChart.   No podemos decirle cul ser su copago por los medicamentos por adelantado ya que esto es diferente dependiendo de la cobertura de su seguro.  Sin embargo, es posible que podamos encontrar un medicamento sustituto a menor costo o llenar un formulario para que el seguro cubra el medicamento que se considera necesario.   Si se requiere una autorizacin previa para que su compaa de seguros cubra su medicamento, por favor permtanos de 1 a 2 das hbiles para completar este proceso.  Los precios de los medicamentos varan con frecuencia dependiendo del lugar de dnde se surte la receta y alguna farmacias pueden ofrecer precios ms baratos.  El sitio web www.goodrx.com tiene cupones para medicamentos de diferentes farmacias. Los precios aqu no tienen en cuenta lo que podra costar con la ayuda del seguro (puede ser ms barato con su seguro), pero el sitio web puede darle el precio si no utiliz ningn seguro.  - Puede imprimir el cupn correspondiente y llevarlo con su receta a la farmacia.  - Tambin puede pasar por nuestra oficina durante el horario de atencin regular y recoger una tarjeta de cupones de GoodRx.  -   Si necesita que su receta se enve electrnicamente a una farmacia diferente, informe a nuestra oficina a travs de MyChart de [place] o por telfono llamando al [phone] y presione la opcin 4.  

## 2022-02-28 ENCOUNTER — Encounter: Payer: Self-pay | Admitting: Dermatology

## 2022-04-26 ENCOUNTER — Ambulatory Visit: Payer: Self-pay | Admitting: Orthopedic Surgery

## 2022-05-03 ENCOUNTER — Other Ambulatory Visit: Payer: Self-pay

## 2022-05-03 ENCOUNTER — Ambulatory Visit
Admission: RE | Admit: 2022-05-03 | Discharge: 2022-05-03 | Disposition: A | Discharge status: Home / Self Care | Payer: Self-pay | Source: Ambulatory Visit | Attending: Orthopedic Surgery | Admitting: Orthopedic Surgery

## 2022-05-03 DIAGNOSIS — Z049 Encounter for examination and observation for unspecified reason: Secondary | ICD-10-CM

## 2022-05-04 NOTE — Progress Notes (Signed)
Referring Physician:  No referring provider defined for this encounter.  Primary Physician:  Paul Hector, MD  History of Present Illness: 05/04/2022 [name] has been seeing Dr. Harlow [name] at [facility] for his lumbar spine.   He has intermittent right sided LBP with no current leg pain x 5 years, but has been worse in last 2-3 months. Pain is worse with standing and walking. No pain with sitting. He sleeps okay at night. Grocery cart helps. No numbness, tingling, or weakness.   No bowel or bladder issues.   History of GERD, HLD, OSA with CPAP, and thyroid disease.   Conservative measures:  Physical therapy: none  Multimodal medical therapy including regular antiinflammatories: tylenol, motrin, aleve  Injections: No epidural steroid injections  Past Surgery: No spinal surgery  Paul [name] has no symptoms of cervical myelopathy.  The symptoms are causing a significant impact on the patient's life.   Review of Systems:  A 10 point review of systems is negative, except for the pertinent positives and negatives detailed in the HPI.  Past Medical History: Past Medical History:  Diagnosis Date   Actinic keratosis    Basal cell carcinoma 10/09/2017   R upper back medial to sup scapula   Hypothyroidism     Past Surgical History: No past surgical history on file.  Allergies: Allergies as of 05/06/2022   (No Known Allergies)    Medications: Outpatient Encounter Medications as of 05/06/2022  Medication Sig   acyclovir (ZOVIRAX) 400 MG tablet Take by mouth. (Patient not taking: Reported on 12/26/2019)   doxycycline (VIBRAMYCIN) 50 MG capsule Take 1 capsule (50 mg total) by mouth daily. Take with food and drink   HYDROcodone-acetaminophen (NORCO) 5-325 MG tablet Take 1 tablet by mouth every 4 (four) hours as needed for moderate pain. (Patient not taking: Reported on 12/26/2019)   ketoconazole (NIZORAL) 2 % cream Apply 1 Application topically at bedtime. Qhs to  feet   levothyroxine (SYNTHROID) 75 MCG tablet Take 75 mcg by mouth daily.   meloxicam (MOBIC) 15 MG tablet meloxicam 15 mg tablet  TAKE ONE TABLET EVERY DAY   polyethylene glycol (MIRALAX / GLYCOLAX) packet Take 17 g by mouth daily. Mix one tablespoon with 8oz of your favorite juice or water every day until you are having soft formed stools. Then start taking once daily if you didn't have a stool the day before. (Patient not taking: Reported on 12/26/2019)   pravastatin (PRAVACHOL) 10 MG tablet Take 1 tablet by mouth at bedtime.   tamsulosin (FLOMAX) 0.4 MG CAPS capsule Take 1 capsule (0.4 mg total) by mouth daily after supper. (Patient not taking: Reported on 12/26/2019)   tamsulosin (FLOMAX) 0.4 MG CAPS capsule Take 1 capsule (0.4 mg total) by mouth daily. (Patient not taking: Reported on 12/26/2019)   No facility-administered encounter medications on file as of 05/06/2022.    Social History: Social History   Tobacco Use   Smoking status: Former    Types: Cigarettes    Quit date: 1999    Years since quitting: 24.8   Smokeless tobacco: Never  Vaping Use   Vaping Use: Never used  Substance Use Topics   Alcohol use: Yes    Comment: rarely   Drug use: No    Family Medical History: Family History  Problem Relation Age of Onset   Prostate cancer Neg Hx    Bladder Cancer Neg Hx    Kidney cancer Neg Hx     Physical Examination:  There were no vitals filed for this visit.  General: Patient is well developed, well nourished, calm, collected, and in no apparent distress. Attention to examination is appropriate.  Respiratory: Patient is breathing without any difficulty.   NEUROLOGICAL:     Awake, alert, oriented to person, place, and time.  Speech is clear and fluent. Fund of knowledge is appropriate.   Cranial Nerves: Pupils equal round and reactive to light.  Facial tone is symmetric.  Facial sensation is symmetric.  ROM of spine:  He has reasonable ROM of lumbar spine with  no pain  No lower lumbar tenderness. Mild tenderness over right SI joint.   No abnormal lesions on exposed skin.   Strength: Side Biceps Triceps Deltoid Interossei Grip Wrist Ext. Wrist Flex.  R '5 5 5 5 5 5 5  '$ L '5 5 5 5 5 5 5   '$ Side Iliopsoas Quads Hamstring PF DF EHL  R '5 5 5 5 5 5  '$ L '5 5 5 5 5 5   '$ Reflexes are 2+ and symmetric at the biceps, triceps, brachioradialis, patella and achilles.   Hoffman's is absent.  Clonus is not present.   Bilateral upper and lower extremity sensation is intact to light touch.     Gait is normal.    Medical Decision Making  Imaging: Lumbar MRI dated 04/19/22:  Disc bulge L2-L3 with mild bilateral foraminal stenosis.  Left sided disc bulge L3-L4 with facet hypertrophy and mild bilateral foraminal stenosis.  Broad based disc bulge L4-L5 that effaces right L5 nerve root. Facet hypertrophy and mild central stenosis.  Disc bulge L5-S1 with caudal migration that abuts bilateral S1 nerve [name]. Mild bilateral foraminal stenosis.   Lumbar xrays dated 02/14/22:  Left sided lumbar scoliosis with spondylosis.   MRI report scanned into chart. Xray report not available.   Assessment and Plan: Paul [name] is a pleasant 71 y.o. male with intermittent right sided LBP with no current leg pain x 5 years, but has been worse in last 2-3 months. Pain is worse with standing and walking. No pain with sitting.   He has known left lumbar scoliosis with multilevel disc bulging and foraminal stenosis. LBP likely multifactorial. Some of his pain may be from right SI joint as well.   Treatment options discussed with patient and following plan made:   - Referral to PMR (Paul [name]) for possible lumbar injections.  - Discussed PT for lumbar spine and he is not interested. - Discussed trial of prescription strength NSAID. He wants to hold off.    I spent a total of 30 minutes in face-to-face and non-face-to-face activities related to this patient's care toda including review  of outside records, review of imaging, review of symptoms, physical exam, discussion of differential diagnosis, discussion of treatment options, and documentation.   Thank you for involving me in the care of this patient.   Paul Boot PA-C Dept. of Neurosurgery

## 2022-05-06 ENCOUNTER — Encounter: Payer: Self-pay | Admitting: Orthopedic Surgery

## 2022-05-06 ENCOUNTER — Ambulatory Visit (INDEPENDENT_AMBULATORY_CARE_PROVIDER_SITE_OTHER): Payer: Medicare Other | Admitting: Orthopedic Surgery

## 2022-05-06 VITALS — BP 130/82 | Ht 71.0 in | Wt 230.4 lb

## 2022-05-06 DIAGNOSIS — M47816 Spondylosis without myelopathy or radiculopathy, lumbar region: Secondary | ICD-10-CM | POA: Diagnosis not present

## 2022-05-06 DIAGNOSIS — M5136 Other intervertebral disc degeneration, lumbar region: Secondary | ICD-10-CM | POA: Diagnosis not present

## 2022-05-06 DIAGNOSIS — M419 Scoliosis, unspecified: Secondary | ICD-10-CM | POA: Diagnosis not present

## 2022-05-06 NOTE — Patient Instructions (Signed)
It was so nice to see you today, I am sorry that you are hurting.   Your lower back xrays/MRI showed lumbar scoliosis with wear and tear in your back (degeneration of the disc and arthritis in the joints).    I sent a referral to Dr. Sharlet Salina to discuss possible injections in your back.   I will see you back in 6-7 weeks. Please do not hesitate to call if you have any questions or concerns. You can also message me in [place].   If you have not heard back from Dr. Sharlet Salina' office by next week, please call the office so we can help you get these things scheduled.   Geronimo Boot PA-C [phone]

## 2022-06-28 ENCOUNTER — Ambulatory Visit: Payer: Medicare Other | Admitting: Orthopedic Surgery

## 2022-10-29 ENCOUNTER — Other Ambulatory Visit: Payer: Self-pay | Admitting: Dermatology

## 2022-10-29 DIAGNOSIS — L719 Rosacea, unspecified: Secondary | ICD-10-CM

## 2023-01-04 ENCOUNTER — Ambulatory Visit (INDEPENDENT_AMBULATORY_CARE_PROVIDER_SITE_OTHER): Payer: Medicare Other | Admitting: Dermatology

## 2023-01-04 VITALS — BP 128/80 | HR 64

## 2023-01-04 DIAGNOSIS — L719 Rosacea, unspecified: Secondary | ICD-10-CM | POA: Diagnosis not present

## 2023-01-04 DIAGNOSIS — Z79899 Other long term (current) drug therapy: Secondary | ICD-10-CM

## 2023-01-04 DIAGNOSIS — Z85828 Personal history of other malignant neoplasm of skin: Secondary | ICD-10-CM

## 2023-01-04 DIAGNOSIS — B353 Tinea pedis: Secondary | ICD-10-CM

## 2023-01-04 DIAGNOSIS — D1801 Hemangioma of skin and subcutaneous tissue: Secondary | ICD-10-CM

## 2023-01-04 DIAGNOSIS — L814 Other melanin hyperpigmentation: Secondary | ICD-10-CM | POA: Diagnosis not present

## 2023-01-04 DIAGNOSIS — L821 Other seborrheic keratosis: Secondary | ICD-10-CM

## 2023-01-04 DIAGNOSIS — W908XXA Exposure to other nonionizing radiation, initial encounter: Secondary | ICD-10-CM

## 2023-01-04 DIAGNOSIS — Z1283 Encounter for screening for malignant neoplasm of skin: Secondary | ICD-10-CM

## 2023-01-04 DIAGNOSIS — Z872 Personal history of diseases of the skin and subcutaneous tissue: Secondary | ICD-10-CM

## 2023-01-04 DIAGNOSIS — D229 Melanocytic nevi, unspecified: Secondary | ICD-10-CM

## 2023-01-04 DIAGNOSIS — L578 Other skin changes due to chronic exposure to nonionizing radiation: Secondary | ICD-10-CM

## 2023-01-04 DIAGNOSIS — Z7189 Other specified counseling: Secondary | ICD-10-CM

## 2023-01-04 MED ORDER — FLUCONAZOLE 200 MG PO TABS: ORAL_TABLET | ORAL | 0 refills | Status: DC

## 2023-01-04 MED ORDER — KETOCONAZOLE 2 % EX CREA
1.0000 | TOPICAL_CREAM | Freq: Every day | CUTANEOUS | 4 refills | Status: DC
Start: 2023-01-04 — End: 2023-08-29

## 2023-01-04 NOTE — Patient Instructions (Addendum)
Continue doxycycline 50 mg by mouth daily in evening with food and drink  Doxycycline should be taken with food to prevent nausea. Do not lay down for 30 minutes after taking. Be cautious with sun exposure and use good sun protection while on this medication. Pregnant women should not take this medication.   Continue Skin Medicinals Rosacea oxymetazoline nightly     Rosacea  What is rosacea? Rosacea (say: ro-zay-sha) is a common skin disease that usually begins as a trend of flushing or blushing easily.  As rosacea progresses, a persistent redness in the center of the face will develop and may gradually spread beyond the nose and cheeks to the forehead and chin.  In some cases, the ears, chest, and back could be affected.  Rosacea may appear as tiny blood vessels or small red bumps that occur in crops.  Frequently they can contain pus, and are called "pustules".  If the bumps do not contain pus, they are referred to as "papules".  Rarely, in prolonged, untreated cases of rosacea, the oil glands of the nose and cheeks may become permanently enlarged.  This is called rhinophyma, and is seen more frequently in men.  Signs and Risks In its beginning stages, rosacea tends to come and go, which makes it difficult to recognize.  It can start as intermittent flushing of the face.  Eventually, blood vessels may become permanently visible.  Pustules and papules can appear, but can be mistaken for adult acne.  People of all races, ages, genders and ethnic groups are at risk of developing rosacea.  However, it is more common in women (especially around menopause) and adults with fair skin between the ages of 42 and 69.  Treatment Dermatologists typically recommend a combination of treatments to effectively manage rosacea.  Treatment can improve symptoms and may stop the progression of the rosacea.  Treatment may involve both topical and oral medications.  The tetracycline antibiotics are often used for their  anti-inflammatory effect; however, because of the possibility of developing antibiotic resistance, they should not be used long term at full dose.  For dilated blood vessels the options include electrodessication (uses electric current through a small needle), laser treatment, and cosmetics to hide the redness.   With all forms of treatment, improvement is a slow process, and patients may not see any results for the first 3-4 weeks.  It is very important to avoid the sun and other triggers.  Patients must wear sunscreen daily.  Skin Care Instructions: Cleanse the skin with a mild soap such as CeraVe cleanser, Cetaphil cleanser, or Dove soap once or twice daily as needed. Moisturize with Eucerin Redness Relief Daily Perfecting Lotion (has a subtle green tint), CeraVe Moisturizing Cream, or Oil of Olay Daily Moisturizer with sunscreen every morning and/or night as recommended. Makeup should be "non-comedogenic" (won't clog pores) and be labeled "for sensitive skin". Good choices for cosmetics are: Neutrogena, Almay, and Physician's Formula.  Any product with a green tint tends to offset a red complexion. If your eyes are dry and irritated, use artificial tears 2-3 times per day and cleanse the eyelids daily with baby shampoo.  Have your eyes examined at least every 2 years.  Be sure to tell your eye doctor that you have rosacea. Alcoholic beverages tend to cause flushing of the skin, and may make rosacea worse. Always wear sunscreen, protect your skin from extreme hot and cold temperatures, and avoid spicy foods, hot drinks, and mechanical irritation such as rubbing, scrubbing, or  massaging the face.  Avoid harsh skin cleansers, cleansing masks, astringents, and exfoliation. If a particular product burns or makes your face feel tight, then it is likely to flare your rosacea. If you are having difficulty finding a sunscreen that you can tolerate, you may try switching to a chemical-free sunscreen.  These are  ones whose active ingredient is zinc oxide or titanium dioxide only.  They should also be fragrance free, non-comedogenic, and labeled for sensitive skin. Rosacea triggers may vary from person to person.  There are a variety of foods that have been reported to trigger rosacea.  Some patients find that keeping a diary of what they were doing when they flared helps them avoid triggers.      Melanoma ABCDEs  Melanoma is the most dangerous type of skin cancer, and is the leading cause of death from skin disease.  You are more likely to develop melanoma if you: Have light-colored skin, light-colored eyes, or red or blond hair Spend a lot of time in the sun Tan regularly, either outdoors or in a tanning bed Have had blistering sunburns, especially during childhood Have a close family member who has had a melanoma Have atypical moles or large birthmarks  Early detection of melanoma is key since treatment is typically straightforward and cure rates are extremely high if we catch it early.   The first sign of melanoma is often a change in a mole or a new dark spot.  The ABCDE system is a way of remembering the signs of melanoma.  A for asymmetry:  The two halves do not match. B for border:  The edges of the growth are irregular. C for color:  A mixture of colors are present instead of an even brown color. D for diameter:  Melanomas are usually (but not always) greater than 6mm - the size of a pencil eraser. E for evolution:  The spot keeps changing in size, shape, and color.  Please check your skin once per month between visits. You can use a small mirror in front and a large mirror behind you to keep an eye on the back side or your body.   If you see any new or changing lesions before your next follow-up, please call to schedule a visit.  Please continue daily skin protection including broad spectrum sunscreen SPF 30+ to sun-exposed areas, reapplying every 2 hours as needed when you're  outdoors.   Staying in the shade or wearing long sleeves, sun glasses (UVA+UVB protection) and wide brim hats (4-inch brim around the entire circumference of the hat) are also recommended for sun protection.    Due to recent changes in healthcare laws, you may see results of your pathology and/or laboratory studies on MyChart before the doctors have had a chance to review them. We understand that in some cases there may be results that are confusing or concerning to you. Please understand that not all results are received at the same time and often the doctors may need to interpret multiple results in order to provide you with the best plan of care or course of treatment. Therefore, we ask that you please give [place] 2 business days to thoroughly review all your results before contacting the office for clarification. Should we see a critical lab result, you will be contacted sooner.   If You Need Anything After Your Visit  If you have any questions or concerns for your doctor, please call our main line at [phone] and press option 4  to reach your doctor's medical assistant. If no one answers, please leave a voicemail as directed and we will return your call as soon as possible. Messages left after 4 pm will be answered the following business day.   You may also send [place] a message via MyChart. We typically respond to MyChart messages within 1-2 business days.  For prescription refills, please ask your pharmacy to contact our office. Our fax number is [phone].  If you have an urgent issue when the clinic is closed that cannot wait until the next business day, you can page your doctor at the number below.    Please note that while we do our best to be available for urgent issues outside of office hours, we are not available 24/7.   If you have an urgent issue and are unable to reach [place], you may choose to seek medical care at your doctor's office, retail clinic, urgent care center, or emergency  room.  If you have a medical emergency, please immediately call 911 or go to the emergency department.  Pager Numbers  - Dr. Gwen Pounds: [phone]  - Dr. Neale Burly: [phone]  - Dr. Roseanne Reno: [phone]  In the event of inclement weather, please call our main line at [phone] for an update on the status of any delays or closures.  Dermatology Medication Tips: Please keep the boxes that topical medications come in in order to help keep track of the instructions about where and how to use these. Pharmacies typically print the medication instructions only on the boxes and not directly on the medication tubes.   If your medication is too expensive, please contact our office at [phone] option 4 or send [place] a message through MyChart.   We are unable to tell what your co-pay for medications will be in advance as this is different depending on your insurance coverage. However, we may be able to find a substitute medication at lower cost or fill out paperwork to get insurance to cover a needed medication.   If a prior authorization is required to get your medication covered by your insurance company, please allow [place] 1-2 business days to complete this process.  Drug prices often vary depending on where the prescription is filled and some pharmacies may offer cheaper prices.  The website www.goodrx.com contains coupons for medications through different pharmacies. The prices here do not account for what the cost may be with help from insurance (it may be cheaper with your insurance), but the website can give you the price if you did not use any insurance.  - You can print the associated coupon and take it with your prescription to the pharmacy.  - You may also stop by our office during regular business hours and pick up a GoodRx coupon card.  - If you need your prescription sent electronically to a different pharmacy, notify our office through [facility] or by phone at [phone]  option 4.     Si Usted Necesita Algo Despus de Su Visita  Tambin puede enviarnos un mensaje a travs de Clinical cytogeneticist. Por lo general respondemos a los mensajes de MyChart en el transcurso de 1 a 2 das hbiles.  Para renovar recetas, por favor pida a su farmacia que se ponga en contacto con nuestra oficina. Annie Sable de fax es [place] [phone].  Si tiene un asunto urgente cuando la clnica est cerrada y que no puede esperar hasta el siguiente da hbil, puede llamar/localizar a su doctor(a) al nmero que aparece a  continuacin.   Por favor, tenga en cuenta que aunque hacemos todo lo posible para estar disponibles para asuntos urgentes fuera del horario de [place], no estamos disponibles las 24 horas del da, los 7 [address]  [address] de la [place].   Si tiene un problema urgente y no puede comunicarse con nosotros, puede optar por buscar atencin mdica  en el consultorio de su doctor(a), en una clnica privada, en un centro de atencin urgente o en una sala de emergencias.  Si tiene Engineer, drilling, por favor llame inmediatamente al 911 o vaya a la sala de emergencias.  Nmeros de bper  - Dr. Gwen Pounds: [phone]  - Dra. Moye: [phone]  - Dra. Roseanne Reno: [phone]  En caso de inclemencias del [place], por favor llame a Lacy Duverney principal al [phone] para una actualizacin sobre el [place] de cualquier retraso o cierre.  Consejos para la medicacin en dermatologa: Por favor, guarde las cajas en las que vienen los medicamentos de uso tpico para ayudarle a seguir las instrucciones sobre dnde y cmo usarlos. Las farmacias generalmente imprimen las instrucciones del medicamento slo en las cajas y no directamente en los tubos del [place].   Si su medicamento es muy caro, por favor, pngase en contacto con Rolm Gala llamando al [phone] y presione la opcin 4 o envenos un mensaje a travs de Clinical cytogeneticist.   No podemos decirle cul ser su copago por los medicamentos  por adelantado ya que esto es diferente dependiendo de la cobertura de su seguro. Sin embargo, es posible que podamos encontrar un medicamento sustituto a Audiological scientist un formulario para que el seguro cubra el medicamento que se considera necesario.   Si se requiere una autorizacin previa para que su compaa de seguros [place] su medicamento, por favor permtanos de 1 a 2 das hbiles para completar [address]  Los precios de los medicamentos varan con frecuencia dependiendo del Environmental consultant de dnde se surte la receta y alguna farmacias pueden ofrecer precios ms baratos.  El sitio web www.goodrx.com tiene cupones para medicamentos de Health and safety inspector. Los precios aqu no tienen en cuenta lo que podra costar con la ayuda del seguro (puede ser ms barato con su seguro), pero el sitio web puede darle el precio si no utiliz Tourist information centre manager.  - Puede imprimir el cupn correspondiente y llevarlo con su receta a la farmacia.  - Tambin puede pasar por nuestra oficina durante el horario de atencin regular y Education officer, museum una tarjeta de cupones de GoodRx.  - Si necesita que su receta se enve electrnicamente a una farmacia diferente, informe a nuestra oficina a travs de MyChart de [place] o por telfono llamando al [phone] y presione la opcin 4.

## 2023-01-04 NOTE — Progress Notes (Unsigned)
Follow-Up Visit   Subjective  Paul [name] is a 72 y.o. male who presents for the following: Skin Cancer Screening and Full Body Skin Exam Hx of rosacea , hx of isks, hx of aks, hx of bcc The patient presents for Total-Body Skin Exam (TBSE) for skin cancer screening and mole check. The patient has spots, moles and lesions to be evaluated, some may be new or changing and the patient has concerns that these could be cancer.  The following portions of the chart were reviewed this encounter and updated as appropriate: medications, allergies, medical history  Review of Systems:  No other skin or systemic complaints except as noted in HPI or Assessment and Plan.  Objective  Well appearing patient in no apparent distress; mood and affect are within normal limits.  A full examination was performed including scalp, head, eyes, ears, nose, lips, neck, chest, axillae, abdomen, back, buttocks, bilateral upper extremities, bilateral lower extremities, hands, feet, fingers, toes, fingernails, and toenails. All findings within normal limits unless otherwise noted below.   Relevant physical exam findings are noted in the Assessment and Plan.   Assessment & Plan   LENTIGINES, SEBORRHEIC KERATOSES, HEMANGIOMAS - Benign normal skin lesions - Benign-appearing - Call for any changes  MELANOCYTIC NEVI - Tan-brown and/or pink-flesh-colored symmetric macules and papules - Benign appearing on exam today - Observation - Call clinic for new or changing moles - Recommend daily use of broad spectrum spf 30+ sunscreen to sun-exposed areas.   Rosacea Face Active papule on right cheek Rosacea is a chronic progressive skin condition usually affecting the face of adults, causing redness and/or acne bumps. It is treatable but not curable. It sometimes affects the eyes (ocular rosacea) as well. It may respond to topical and/or systemic medication and can flare with stress, sun exposure, alcohol, exercise and  some foods.  Daily application of broad spectrum spf 30+ sunscreen to face is recommended to reduce flares.  Chronic and persistent condition with duration or expected duration over one year. Condition is symptomatic / bothersome to patient. Not to goal.  Cont Doxycycline 50mg  1 po qd with food and drink Cont Skin Medicinals Rosacea-Oxymetazoline increase to qhs         Long term medication management.  Patient is using long term (months to years) prescription medication  to control their dermatologic condition.  These medications require periodic monitoring to evaluate for efficacy and side effects and may require periodic laboratory monitoring.  Doxycycline should be taken with food to prevent nausea. Do not lay down for 30 minutes after taking. Be cautious with sun exposure and use good sun protection while on this medication. Pregnant women should not take this medication.     Tinea pedis of right foot Right Foot - Posterior Scaling and maceration web spaces and over distal and lateral soles. Chronic and persistent condition with duration or expected duration over one year. Condition is symptomatic / bothersome to patient. Not to goal.  Cont Ketoconazole 2% cr qhs to feet  start diflucan 200 mg capsule by mouth daily on M-W - F  12 pills no refills   ACTINIC DAMAGE - Chronic condition, secondary to cumulative UV/sun exposure - diffuse scaly erythematous macules with underlying dyspigmentation - Recommend daily broad spectrum sunscreen SPF 30+ to sun-exposed areas, reapply every 2 hours as needed.  - Staying in the shade or wearing long sleeves, sun glasses (UVA+UVB protection) and wide brim hats (4-inch brim around the entire circumference of the hat) are  also recommended for sun protection.  - Call for new or changing lesions.  HISTORY OF BASAL CELL CARCINOMA OF THE SKIN Right upper back medial to superior scapula 09/2017 - No evidence of recurrence today - Recommend regular full body  skin exams - Recommend daily broad spectrum sunscreen SPF 30+ to sun-exposed areas, reapply every 2 hours as needed.  - Call if any new or changing lesions are noted between office visits  SKIN CANCER SCREENING PERFORMED TODAY.  Return in about 1 year (around 01/04/2024) for TBSE.  IAsher Muir, CMA, am acting as scribe for Armida Sans, MD.  Documentation: I have reviewed the above documentation for accuracy and completeness, and I agree with the above.  Armida Sans, MD

## 2023-01-05 ENCOUNTER — Encounter: Payer: Self-pay | Admitting: Dermatology

## 2023-08-13 ENCOUNTER — Other Ambulatory Visit: Payer: Self-pay | Admitting: Dermatology

## 2023-08-13 DIAGNOSIS — L719 Rosacea, unspecified: Secondary | ICD-10-CM

## 2023-08-29 ENCOUNTER — Encounter: Payer: Self-pay | Admitting: Ophthalmology

## 2023-09-12 ENCOUNTER — Encounter: Payer: Self-pay | Admitting: Ophthalmology

## 2023-09-12 ENCOUNTER — Encounter
Admission: RE | Disposition: A | Discharge status: Home / Self Care | Payer: Self-pay | Source: Home / Self Care | Attending: Ophthalmology

## 2023-09-12 ENCOUNTER — Other Ambulatory Visit: Payer: Self-pay

## 2023-09-12 ENCOUNTER — Ambulatory Visit
Admission: RE | Admit: 2023-09-12 | Discharge: 2023-09-12 | Disposition: A | Discharge status: Home / Self Care | Payer: Medicare Other | Attending: Ophthalmology | Admitting: Ophthalmology

## 2023-09-12 ENCOUNTER — Ambulatory Visit: Payer: Medicare Other | Admitting: Anesthesiology

## 2023-09-12 DIAGNOSIS — H2511 Age-related nuclear cataract, right eye: Secondary | ICD-10-CM | POA: Insufficient documentation

## 2023-09-12 DIAGNOSIS — E1136 Type 2 diabetes mellitus with diabetic cataract: Secondary | ICD-10-CM | POA: Insufficient documentation

## 2023-09-12 DIAGNOSIS — E039 Hypothyroidism, unspecified: Secondary | ICD-10-CM | POA: Diagnosis not present

## 2023-09-12 DIAGNOSIS — Z87891 Personal history of nicotine dependence: Secondary | ICD-10-CM | POA: Diagnosis not present

## 2023-09-12 HISTORY — PX: CATARACT EXTRACTION W/PHACO: SHX586

## 2023-09-12 HISTORY — DX: Type 2 diabetes mellitus without complications: E11.9

## 2023-09-12 HISTORY — DX: Complete loss of teeth, unspecified cause, unspecified class: K08.109

## 2023-09-12 SURGERY — PHACOEMULSIFICATION, CATARACT, WITH IOL INSERTION
Anesthesia: Monitor Anesthesia Care | Site: Eye | Laterality: Right

## 2023-09-12 MED ORDER — BRIMONIDINE TARTRATE-TIMOLOL 0.2-0.5 % OP SOLN
OPHTHALMIC | Status: DC | PRN
  Administered 2023-09-12: 1 [drp] via OPHTHALMIC

## 2023-09-12 MED ORDER — ARMC OPHTHALMIC DILATING DROPS
OPHTHALMIC | Status: AC
  Filled 2023-09-12: qty 0.5

## 2023-09-12 MED ORDER — ARMC OPHTHALMIC DILATING DROPS
1.0000 | OPHTHALMIC | Status: DC | PRN
  Administered 2023-09-12 (×3): 1 via OPHTHALMIC

## 2023-09-12 MED ORDER — MOXIFLOXACIN HCL 0.5 % OP SOLN
OPHTHALMIC | Status: DC | PRN
  Administered 2023-09-12: .2 mL via OPHTHALMIC

## 2023-09-12 MED ORDER — SIGHTPATH DOSE#1 BSS IO SOLN
INTRAOCULAR | Status: DC | PRN
  Administered 2023-09-12: 15 mL via INTRAOCULAR

## 2023-09-12 MED ORDER — MIDAZOLAM HCL 2 MG/2ML IJ SOLN
INTRAMUSCULAR | Status: AC
  Filled 2023-09-12: qty 2

## 2023-09-12 MED ORDER — SIGHTPATH DOSE#1 BSS IO SOLN
INTRAOCULAR | Status: DC | PRN
  Administered 2023-09-12: 63 mL via OPHTHALMIC

## 2023-09-12 MED ORDER — TETRACAINE HCL 0.5 % OP SOLN
1.0000 [drp] | OPHTHALMIC | Status: DC | PRN
  Administered 2023-09-12 (×3): 1 [drp] via OPHTHALMIC

## 2023-09-12 MED ORDER — TETRACAINE HCL 0.5 % OP SOLN
OPHTHALMIC | Status: AC
  Filled 2023-09-12: qty 4

## 2023-09-12 MED ORDER — FENTANYL CITRATE (PF) 100 MCG/2ML IJ SOLN
INTRAMUSCULAR | Status: AC
  Filled 2023-09-12: qty 2

## 2023-09-12 MED ORDER — MIDAZOLAM HCL 2 MG/2ML IJ SOLN
INTRAMUSCULAR | Status: DC | PRN
  Administered 2023-09-12: 2 mg via INTRAVENOUS

## 2023-09-12 MED ORDER — SIGHTPATH DOSE#1 NA CHONDROIT SULF-NA HYALURON 40-17 MG/ML IO SOLN
INTRAOCULAR | Status: DC | PRN
  Administered 2023-09-12: 1 mL via INTRAOCULAR

## 2023-09-12 MED ORDER — FENTANYL CITRATE (PF) 100 MCG/2ML IJ SOLN
INTRAMUSCULAR | Status: DC | PRN
  Administered 2023-09-12: 50 ug via INTRAVENOUS

## 2023-09-12 MED ORDER — SIGHTPATH DOSE#1 BSS IO SOLN
INTRAOCULAR | Status: DC | PRN
  Administered 2023-09-12: 2 mL

## 2023-09-12 SURGICAL SUPPLY — 14 items
CANNULA ANT/CHMB 27G (MISCELLANEOUS) IMPLANT
CANNULA ANT/CHMB 27GA (MISCELLANEOUS) IMPLANT
CATARACT SUITE SIGHTPATH (MISCELLANEOUS) ×1 IMPLANT
CYSTOTOME ANG REV CUT SHRT 25G (CUTTER) ×1 IMPLANT
CYSTOTOME ANGL RVRS SHRT 25G (CUTTER) ×2 IMPLANT
FEE CATARACT SUITE SIGHTPATH (MISCELLANEOUS) ×2 IMPLANT
GLOVE BIOGEL PI IND STRL 8 (GLOVE) ×2 IMPLANT
GLOVE SURG LX STRL 8.0 MICRO (GLOVE) ×2 IMPLANT
GLOVE SURG PROTEXIS BL SZ6.5 (GLOVE) ×1 IMPLANT
GLOVE SURG SYN 6.5 PF PI BL (GLOVE) ×2 IMPLANT
LENS IOL TECNIS EYHANCE 21.5 (Intraocular Lens) IMPLANT
NDL FILTER BLUNT 18X1 1/2 (NEEDLE) ×2 IMPLANT
NEEDLE FILTER BLUNT 18X1 1/2 (NEEDLE) ×1 IMPLANT
SYR 3ML LL SCALE MARK (SYRINGE) ×2 IMPLANT

## 2023-09-12 NOTE — Anesthesia Preprocedure Evaluation (Addendum)
 Anesthesia Evaluation  Patient identified by MRN, date of birth, ID band Patient awake    Reviewed: Allergy & Precautions, H&P , NPO status , Patient's Chart, lab work & pertinent test results  Airway Mallampati: III  TM Distance: >3 FB Neck ROM: Full    Dental no notable dental hx. (+) Upper Dentures, Lower Dentures   Pulmonary former smoker   Pulmonary exam normal breath sounds clear to auscultation       Cardiovascular negative cardio ROS Normal cardiovascular exam Rhythm:Regular Rate:Normal     Neuro/Psych negative neurological ROS  negative psych ROS   GI/Hepatic negative GI ROS, Neg liver ROS,,,  Endo/Other  diabetesHypothyroidism    Renal/GU negative Renal ROS  negative genitourinary   Musculoskeletal negative musculoskeletal ROS (+)    Abdominal   Peds negative pediatric ROS (+)  Hematology negative hematology ROS (+)   Anesthesia Other Findings Hypothyroid BCC Type II diabetes  Reproductive/Obstetrics negative OB ROS                              Anesthesia Physical Anesthesia Plan  ASA: 2  Anesthesia Plan: MAC   Post-op Pain Management:    Induction: Intravenous  PONV Risk Score and Plan:   Airway Management Planned: Natural Airway and Nasal Cannula  Additional Equipment:   Intra-op Plan:   Post-operative Plan:   Informed Consent: I have reviewed the patients History and Physical, chart, labs and discussed the procedure including the risks, benefits and alternatives for the proposed anesthesia with the patient or authorized representative who has indicated his/her understanding and acceptance.     Dental Advisory Given  Plan Discussed with: Anesthesiologist, CRNA and Surgeon  Anesthesia Plan Comments: (Patient consented for risks of anesthesia including but not limited to:  - adverse reactions to medications - damage to eyes, teeth, lips or other oral  mucosa - nerve damage due to positioning  - sore throat or hoarseness - Damage to heart, brain, nerves, lungs, other parts of body or loss of life  Patient voiced understanding and assent.)         Anesthesia Quick Evaluation

## 2023-09-12 NOTE — H&P (Signed)
 [facility]   Primary Care Physician:  Lynnea Ferrier, MD Ophthalmologist: Dr. Druscilla Brownie  Pre-Procedure History & Physical: HPI:  Paul [name] is a 73 y.o. male here for cataract surgery.   Past Medical History:  Diagnosis Date   Actinic keratosis    Basal cell carcinoma 10/09/2017   R upper back medial to sup scapula   Full dentures    Hypothyroidism    Type II diabetes mellitus (HCC)     History reviewed. No pertinent surgical history.  Prior to Admission medications   Medication Sig Start Date End Date Taking? Authorizing Provider  doxycycline (VIBRAMYCIN) 50 MG capsule TAKE 1 CAPSULE BY MOUTH EVERY DAY 08/14/23  Yes Deirdre Evener, MD  levothyroxine (SYNTHROID) 75 MCG tablet Take 75 mcg by mouth daily. 12/11/21  Yes [provider]  pravastatin (PRAVACHOL) 10 MG tablet Take 1 tablet by mouth at bedtime. 10/05/21 08/29/23 Yes [provider]    Allergies as of 08/23/2023 - Review Complete 01/05/2023  Allergen Reaction Noted   Penicillin g Rash 06/20/2019    Family History  Problem Relation Age of Onset   Prostate cancer Neg Hx    Bladder Cancer Neg Hx    Kidney cancer Neg Hx     Social History   Socioeconomic History   Marital status: Married    Spouse name: Not on file   Number of children: Not on file   Years of education: Not on file   Highest education level: Not on file  Occupational History   Not on file  Tobacco Use   Smoking status: Former    Current packs/day: 0.00    Types: Cigarettes    Quit date: 1999    Years since quitting: 26.1   Smokeless tobacco: Never  Vaping Use   Vaping status: Never Used  Substance and Sexual Activity   Alcohol use: Yes    Comment: occasionally   Drug use: No   Sexual activity: Not on file  Other Topics Concern   Not on file  Social History Narrative   Not on file   Social Drivers of Health   Financial Resource Strain: Low Risk  (09/13/2022)   Received from [facility]   Overall Financial Resource Strain (CARDIA)    Difficulty of Paying Living Expenses: Not hard at all  Food Insecurity: No Food Insecurity (09/13/2022)   Received from [facility]   Hunger Vital Sign    Worried About Running Out of Food in the Last Year: Never true    Ran Out of Food in the Last Year: Never true  Transportation Needs: No Transportation Needs (09/13/2022)   Received from [facility]   PRAPARE - Transportation    In the past 12 months, has lack of transportation kept you from medical appointments or from getting medications?: No    Lack of Transportation (Non-Medical): No  Physical Activity: Not on file  Stress: Not on file  Social Connections: Not on file  Intimate Partner Violence: Not on file    Review of Systems: See HPI, otherwise negative ROS  Physical Exam: BP 122/83   Temp 98 F (36.7 C)   Resp 12   Ht 5\' 10"  (1.778 m)   Wt 103.9 kg   SpO2 94%   BMI 32.86 kg/m  General:   Alert, cooperative in NAD Head:  Normocephalic and atraumatic. Respiratory:  Normal work of breathing. Cardiovascular:  RRR  Impression/Plan: Levander Campion  Doggett is here for cataract surgery.  Risks, benefits, limitations, and alternatives regarding cataract surgery have been reviewed with the patient.  Questions have been answered.  All parties agreeable.   Galen Manila, MD  09/12/2023, 9:06 AM

## 2023-09-12 NOTE — Transfer of Care (Signed)
 Immediate Anesthesia Transfer of Care Note  Patient: Paul [name]  Procedure(s) Performed: CATARACT EXTRACTION PHACO AND INTRAOCULAR LENS PLACEMENT (IOC) RIGHT 3.44 00:32.1 (Right: Eye)  Patient Location: PACU  Anesthesia Type: MAC  Level of Consciousness: awake, alert  and patient cooperative  Airway and Oxygen Therapy: Patient Spontanous Breathing and Patient connected to supplemental oxygen  Post-op Assessment: Post-op Vital signs reviewed, Patient's Cardiovascular Status Stable, Respiratory Function Stable, Patent Airway and No signs of Nausea or vomiting  Post-op Vital Signs: Reviewed and stable  Complications: No notable events documented.

## 2023-09-12 NOTE — Discharge Instructions (Signed)

## 2023-09-12 NOTE — Anesthesia Postprocedure Evaluation (Signed)
 Anesthesia Post Note  Patient: [name]  Procedure(s) Performed: CATARACT EXTRACTION PHACO AND INTRAOCULAR LENS PLACEMENT (IOC) RIGHT 3.44 00:32.1 (Right: Eye)  Patient location during evaluation: PACU Anesthesia Type: MAC Level of consciousness: awake and alert Pain management: pain level controlled Vital Signs Assessment: post-procedure vital signs reviewed and stable Respiratory status: spontaneous breathing, nonlabored ventilation, respiratory function stable and patient connected to nasal cannula oxygen Cardiovascular status: stable and blood pressure returned to baseline Postop Assessment: no apparent nausea or vomiting Anesthetic complications: no   No notable events documented.   Last Vitals:  Vitals:   09/12/23 0927 09/12/23 0931  BP: 118/78 110/77  Pulse: 67 66  Resp: 14 14  Temp: 36.8 C 36.8 C  SpO2: 94% 94%    Last Pain:  Vitals:   09/12/23 0931  PainSc: 0-No pain                 Cleotha Whalin C Sheleen Conchas

## 2023-09-12 NOTE — Op Note (Signed)
 PREOPERATIVE DIAGNOSIS:  Nuclear sclerotic cataract of the right eye.   POSTOPERATIVE DIAGNOSIS:  Cataract   OPERATIVE PROCEDURE:ORPROCALL@   SURGEON:  Galen Manila, MD.   ANESTHESIA:  Anesthesiologist: Marisue Humble, MD CRNA: Barbette Hair, CRNA  1.      Managed anesthesia care. 2.      0.2ml of Shugarcaine was instilled in the eye following the paracentesis.   COMPLICATIONS:  None.   TECHNIQUE:   Stop and chop   DESCRIPTION OF PROCEDURE:  The patient was examined and consented in the preoperative holding area where the aforementioned topical anesthesia was applied to the right eye and then brought back to the Operating Room where the right eye was prepped and draped in the usual sterile ophthalmic fashion and a lid speculum was placed. A paracentesis was created with the side port blade and the anterior chamber was filled with viscoelastic. A near clear corneal incision was performed with the steel keratome. A continuous curvilinear capsulorrhexis was performed with a cystotome followed by the capsulorrhexis forceps. Hydrodissection and hydrodelineation were carried out with BSS on a blunt cannula. The lens was removed in a stop and chop  technique and the remaining cortical material was removed with the irrigation-aspiration handpiece. The capsular bag was inflated with viscoelastic and the Technis ZCB00  lens was placed in the capsular bag without complication. The remaining viscoelastic was removed from the eye with the irrigation-aspiration handpiece. The wounds were hydrated. The anterior chamber was flushed with BSS and the eye was inflated to physiologic pressure. 0.55ml of Vigamox was placed in the anterior chamber. The wounds were found to be water tight. The eye was dressed with Combigan. The patient was given protective glasses to wear throughout the day and a shield with which to sleep tonight. The patient was also given drops with which to begin a drop regimen today and will  follow-up with me in one day. Implant Name Type Inv. Item Serial No. Manufacturer Lot No. LRB No. Used Action  LENS IOL TECNIS EYHANCE 21.5 - [id number] Intraocular Lens LENS IOL TECNIS EYHANCE 21.5 [id number] [employer]  Right 1 Implanted   Procedure(s): CATARACT EXTRACTION PHACO AND INTRAOCULAR LENS PLACEMENT (IOC) RIGHT 3.44 00:32.1 (Right)  Electronically signed: Galen Manila 09/12/2023 9:27 AM

## 2023-09-14 ENCOUNTER — Encounter: Payer: Self-pay | Admitting: Ophthalmology

## 2024-01-10 ENCOUNTER — Ambulatory Visit (INDEPENDENT_AMBULATORY_CARE_PROVIDER_SITE_OTHER): Payer: Medicare Other | Admitting: Dermatology

## 2024-01-10 ENCOUNTER — Encounter: Payer: Self-pay | Admitting: Dermatology

## 2024-01-10 DIAGNOSIS — Z1283 Encounter for screening for malignant neoplasm of skin: Secondary | ICD-10-CM

## 2024-01-10 DIAGNOSIS — L814 Other melanin hyperpigmentation: Secondary | ICD-10-CM

## 2024-01-10 DIAGNOSIS — L820 Inflamed seborrheic keratosis: Secondary | ICD-10-CM | POA: Diagnosis not present

## 2024-01-10 DIAGNOSIS — Z7189 Other specified counseling: Secondary | ICD-10-CM

## 2024-01-10 DIAGNOSIS — Z85828 Personal history of other malignant neoplasm of skin: Secondary | ICD-10-CM

## 2024-01-10 DIAGNOSIS — L578 Other skin changes due to chronic exposure to nonionizing radiation: Secondary | ICD-10-CM | POA: Diagnosis not present

## 2024-01-10 DIAGNOSIS — Z79899 Other long term (current) drug therapy: Secondary | ICD-10-CM

## 2024-01-10 DIAGNOSIS — L719 Rosacea, unspecified: Secondary | ICD-10-CM

## 2024-01-10 DIAGNOSIS — D229 Melanocytic nevi, unspecified: Secondary | ICD-10-CM

## 2024-01-10 MED ORDER — IVERMECTIN 1 % EX CREA: 1.0000 | TOPICAL_CREAM | Freq: Every morning | CUTANEOUS | 11 refills | Status: AC

## 2024-01-10 MED ORDER — DOXYCYCLINE HYCLATE 50 MG PO CAPS
50.0000 mg | ORAL_CAPSULE | Freq: Every day | ORAL | 4 refills | Status: AC
Start: 2024-01-10 — End: ?

## 2024-01-10 NOTE — Patient Instructions (Addendum)
Cryotherapy Aftercare  Wash gently with soap and water everyday.   Apply Vaseline and Band-Aid daily until healed.   Instructions for [facility] Medications  One or more of your medications was sent to the [facility] mail order compounding pharmacy. You will receive an email from them and can purchase the medicine through that link. It will then be mailed to your home at the address you confirmed. If for any reason you do not receive an email from them, please check your spam folder. If you still do not find the email, please let us know. [facility] phone number is [phone].     Due to recent changes in healthcare laws, you may see results of your pathology and/or laboratory studies on MyChart before the doctors have had a chance to review them. We understand that in some cases there may be results that are confusing or concerning to you. Please understand that not all results are received at the same time and often the doctors may need to interpret multiple results in order to provide you with the best plan of care or course of treatment. Therefore, we ask that you please give [place] 2 business days to thoroughly review all your results before contacting the office for clarification. Should we see a critical lab result, you will be contacted sooner.   If You Need Anything After Your Visit  If you have any questions or concerns for your doctor, please call our main line at [phone] and press option 4 to reach your doctor's medical assistant. If no one answers, please leave a voicemail as directed and we will return your call as soon as possible. Messages left after 4 pm will be answered the following business day.   You may also send [place] a message via MyChart. We typically respond to MyChart messages within 1-2 business days.  For prescription refills, please ask your pharmacy to contact our office. Our fax number is [phone].  If you have an urgent issue when the clinic  is closed that cannot wait until the next business day, you can page your doctor at the number below.    Please note that while we do our best to be available for urgent issues outside of office hours, we are not available 24/7.   If you have an urgent issue and are unable to reach [place], you may choose to seek medical care at your doctor's office, retail clinic, urgent care center, or emergency room.  If you have a medical emergency, please immediately call 911 or go to the emergency department.  Pager Numbers  - Dr. Gwen Pounds: [phone]  - Dr. Roseanne Reno: [phone]  - Dr. Katrinka Blazing: [phone]   In the event of inclement weather, please call our main line at [phone] for an update on the status of any delays or closures.  Dermatology Medication Tips: Please keep the boxes that topical medications come in in order to help keep track of the instructions about where and how to use these. Pharmacies typically print the medication instructions only on the boxes and not directly on the medication tubes.   If your medication is too expensive, please contact our office at [phone] option 4 or send [place] a message through MyChart.   We are unable to tell what your co-pay for medications will be in advance as this is different depending on your insurance coverage. However, we may be able to find a substitute medication at lower cost or fill out paperwork to get insurance to  cover a needed medication.   If a prior authorization is required to get your medication covered by your insurance company, please allow [place] 1-2 business days to complete this process.  Drug prices often vary depending on where the prescription is filled and some pharmacies may offer cheaper prices.  The website www.goodrx.com contains coupons for medications through different pharmacies. The prices here do not account for what the cost may be with help from insurance (it may be cheaper with your insurance), but the website  can give you the price if you did not use any insurance.  - You can print the associated coupon and take it with your prescription to the pharmacy.  - You may also stop by our office during regular business hours and pick up a GoodRx coupon card.  - If you need your prescription sent electronically to a different pharmacy, notify our office through [facility] or by phone at [phone] option 4.     Si Usted Necesita Algo Despus de Su Visita  Tambin puede enviarnos un mensaje a travs de Clinical cytogeneticist. Por lo general respondemos a los mensajes de MyChart en el transcurso de 1 a 2 das hbiles.  Para renovar recetas, por favor pida a su farmacia que se ponga en contacto con nuestra oficina. Annie Sable de fax es [place] [phone].  Si tiene un asunto urgente cuando la clnica est cerrada y que no puede esperar hasta el siguiente da hbil, puede llamar/localizar a su doctor(a) al nmero que aparece a continuacin.   Por favor, tenga en cuenta que aunque hacemos todo lo posible para estar disponibles para asuntos urgentes fuera del horario de [place], no estamos disponibles las 24 horas del da, los 7 [address]  [address] de la [place].   Si tiene un problema urgente y no puede comunicarse con nosotros, puede optar por buscar atencin mdica  en el consultorio de su doctor(a), en una clnica privada, en un centro de atencin urgente o en una sala de emergencias.  Si tiene Engineer, drilling, por favor llame inmediatamente al 911 o vaya a la sala de emergencias.  Nmeros de bper  - Dr. Gwen Pounds: [phone]  - Dra. Roseanne Reno: [phone]  - Dr. Katrinka Blazing: [phone]   En caso de inclemencias del tiempo, por favor llame a Lacy Duverney principal al [phone] para una actualizacin sobre el [place] de cualquier retraso o cierre.  Consejos para la medicacin en dermatologa: Por favor, guarde las cajas en las que vienen los medicamentos de uso tpico para ayudarle a seguir las instrucciones  sobre dnde y cmo usarlos. Las farmacias generalmente imprimen las instrucciones del medicamento slo en las cajas y no directamente en los tubos del [place].   Si su medicamento es muy caro, por favor, pngase en contacto con Rolm Gala llamando al [phone] y presione la opcin 4 o envenos un mensaje a travs de Clinical cytogeneticist.   No podemos decirle cul ser su copago por los medicamentos por adelantado ya que esto es diferente dependiendo de la cobertura de su seguro. Sin embargo, es posible que podamos encontrar un medicamento sustituto a Audiological scientist un formulario para que el seguro cubra el medicamento que se considera necesario.   Si se requiere una autorizacin previa para que su compaa de seguros [place] su medicamento, por favor permtanos de 1 a 2 das hbiles para completar [address]  Los precios de los medicamentos varan con frecuencia dependiendo del Environmental consultant de dnde se surte la receta y alguna farmacias pueden ofrecer precios  ms baratos.  El sitio web www.goodrx.com tiene cupones para medicamentos de Health and safety inspector. Los precios aqu no tienen en cuenta lo que podra costar con la ayuda del seguro (puede ser ms barato con su seguro), pero el sitio web puede darle el precio si no utiliz Tourist information centre manager.  - Puede imprimir el cupn correspondiente y llevarlo con su receta a la farmacia.  - Tambin puede pasar por nuestra oficina durante el horario de atencin regular y Education officer, museum una tarjeta de cupones de GoodRx.  - Si necesita que su receta se enve electrnicamente a una farmacia diferente, informe a nuestra oficina a travs de MyChart de [place] o por telfono llamando al [phone] y presione la opcin 4.

## 2024-01-10 NOTE — Progress Notes (Signed)
 Follow-Up Visit   Subjective  Paul [name] is a 73 y.o. male who presents for the following: Skin Cancer Screening and Full Body Skin Exam Rosacea face, Doxycycline  50mg  1 po prn flares, SM Rosacea-Oxymetazoline increase prn, hx of BCC, Aks, growth R axilla, not sure how long it has been there  The patient presents for Total-Body Skin Exam (TBSE) for skin cancer screening and mole check. The patient has spots, moles and lesions to be evaluated, some may be new or changing and the patient may have concern these could be cancer.  The following portions of the chart were reviewed this encounter and updated as appropriate: medications, allergies, medical history  Review of Systems:  No other skin or systemic complaints except as noted in HPI or Assessment and Plan.  Objective  Well appearing patient in no apparent distress; mood and affect are within normal limits.  A full examination was performed including scalp, head, eyes, ears, nose, lips, neck, chest, axillae, abdomen, back, buttocks, bilateral upper extremities, bilateral lower extremities, hands, feet, fingers, toes, fingernails, and toenails. All findings within normal limits unless otherwise noted below.   Relevant physical exam findings are noted in the Assessment and Plan.  Right Axilla x 3, R post shoulder scapular area x 2 (5) Stuck on waxy paps with erythema  Assessment & Plan   SKIN CANCER SCREENING PERFORMED TODAY.  ACTINIC DAMAGE - Chronic condition, secondary to cumulative UV/sun exposure - diffuse scaly erythematous macules with underlying dyspigmentation - Recommend daily broad spectrum sunscreen SPF 30+ to sun-exposed areas, reapply every 2 hours as needed.  - Staying in the shade or wearing long sleeves, sun glasses (UVA+UVB protection) and wide brim hats (4-inch brim around the entire circumference of the hat) are also recommended for sun protection.  - Call for new or changing lesions.  LENTIGINES,  SEBORRHEIC KERATOSES, HEMANGIOMAS - Benign normal skin lesions - Benign-appearing - Call for any changes  MELANOCYTIC NEVI - Tan-brown and/or pink-flesh-colored symmetric macules and papules - Benign appearing on exam today - Observation - Call clinic for new or changing moles - Recommend daily use of broad spectrum spf 30+ sunscreen to sun-exposed areas.   ROSACEA face Exam Mid face erythema with telangiectasias few paps nose Chronic and persistent condition with duration or expected duration over one year. Condition is bothersome/symptomatic for patient. Currently flared. Rosacea is a chronic progressive skin condition usually affecting the face of adults, causing redness and/or acne bumps. It is treatable but not curable. It sometimes affects the eyes (ocular rosacea) as well. It may respond to topical and/or systemic medication and can flare with stress, sun exposure, alcohol, exercise, topical steroids (including hydrocortisone/cortisone 10) and some foods.  Daily application of broad spectrum spf 30+ sunscreen to face is recommended to reduce flares. Treatment Plan Cont Doxycycline  50mg  1 po qd or prn flares Cont SM Rosacea-Oxymetazoline cr qhs or prn flares  Doxycycline  should be taken with food to prevent nausea. Do not lay down for 30 minutes after taking. Be cautious with sun exposure and use good sun protection while on this medication. Pregnant women should not take this medication.   Long term medication management.  Patient is using long term (months to years) prescription medication  to control their dermatologic condition.  These medications require periodic monitoring to evaluate for efficacy and side effects and may require periodic laboratory monitoring.   HISTORY OF BASAL CELL CARCINOMA OF THE SKIN - No evidence of recurrence today - Recommend regular full body  skin exams - Recommend daily broad spectrum sunscreen SPF 30+ to sun-exposed areas, reapply every 2 hours as  needed.  - Call if any new or changing lesions are noted between office visits  - R upper back medial to sup scapula  HISTORY OF PRECANCEROUS ACTINIC KERATOSIS - site(s) of PreCancerous Actinic Keratosis clear today. - these may recur and new lesions may form requiring treatment to prevent transformation into skin cancer - observe for new or changing spots and contact [facility] for appointment if occur - photoprotection with sun protective clothing; sunglasses and broad spectrum sunscreen with SPF of at least 30 + and frequent self skin exams recommended - yearly exams by a dermatologist recommended for persons with history of PreCancerous Actinic Keratoses    INFLAMED SEBORRHEIC KERATOSIS (5) Right Axilla x 3, R post shoulder scapular area x 2 (5) Symptomatic, irritating, patient would like treated. Destruction of lesion - Right Axilla x 3, R post shoulder scapular area x 2 (5) Complexity: simple   Destruction method: cryotherapy   Informed consent: discussed and consent obtained   Timeout:  patient name, date of birth, surgical site, and procedure verified Lesion destroyed using liquid nitrogen: Yes   Region frozen until ice ball extended beyond lesion: Yes   Outcome: patient tolerated procedure well with no complications   Post-procedure details: wound care instructions given   ROSACEA   Related Medications doxycycline  (VIBRAMYCIN ) 50 MG capsule Take 1 capsule (50 mg total) by mouth daily. Take with food and drink Return in about 1 year (around 01/09/2025) for TBSE, Hx of BCC, Hx of AKs.  I, Grayce Saunas, RMA, am acting as scribe for Alm Rhyme, MD .   Documentation: I have reviewed the above documentation for accuracy and completeness, and I agree with the above.  Alm Rhyme, MD

## 2025-01-09 ENCOUNTER — Ambulatory Visit: Admitting: Dermatology
